# Patient Record
Sex: Female | Born: 1940 | Race: White | Hispanic: No | Marital: Married | Smoking: Never smoker
Health system: Southern US, Community
[De-identification: ages and names within clinical notes are randomized; demographics above are authoritative.]

## PROBLEM LIST (undated history)

## (undated) DIAGNOSIS — R112 Nausea with vomiting, unspecified: Secondary | ICD-10-CM

## (undated) DIAGNOSIS — I10 Essential (primary) hypertension: Secondary | ICD-10-CM

## (undated) DIAGNOSIS — E7800 Pure hypercholesterolemia, unspecified: Secondary | ICD-10-CM

## (undated) DIAGNOSIS — M1990 Unspecified osteoarthritis, unspecified site: Secondary | ICD-10-CM

## (undated) DIAGNOSIS — Z9889 Other specified postprocedural states: Secondary | ICD-10-CM

## (undated) DIAGNOSIS — K859 Acute pancreatitis without necrosis or infection, unspecified: Secondary | ICD-10-CM

## (undated) HISTORY — PX: BREAST SURGERY: SHX581

## (undated) HISTORY — PX: COLONOSCOPY: SHX174

## (undated) HISTORY — PX: TUBAL LIGATION: SHX77

## (undated) HISTORY — PX: BACK SURGERY: SHX140

## (undated) HISTORY — PX: JOINT REPLACEMENT: SHX530

---

## 2002-04-04 ENCOUNTER — Encounter: Payer: Self-pay | Admitting: Emergency Medicine

## 2002-04-04 ENCOUNTER — Emergency Department (HOSPITAL_COMMUNITY): Admission: EM | Admit: 2002-04-04 | Discharge: 2002-04-04 | Payer: Self-pay | Admitting: Emergency Medicine

## 2005-04-21 ENCOUNTER — Ambulatory Visit (HOSPITAL_COMMUNITY): Admission: RE | Admit: 2005-04-21 | Discharge: 2005-04-21 | Payer: Self-pay | Admitting: Surgery

## 2005-04-21 ENCOUNTER — Encounter (INDEPENDENT_AMBULATORY_CARE_PROVIDER_SITE_OTHER): Payer: Self-pay | Admitting: Specialist

## 2006-08-30 ENCOUNTER — Other Ambulatory Visit: Admission: RE | Admit: 2006-08-30 | Discharge: 2006-08-30 | Payer: Self-pay | Admitting: Family Medicine

## 2010-06-02 LAB — SURGICAL PCR SCREEN: Staphylococcus aureus: NEGATIVE

## 2010-06-02 LAB — PROTIME-INR
INR: 1.04 (ref 0.00–1.49)
Prothrombin Time: 13.8 seconds (ref 11.6–15.2)

## 2010-06-02 LAB — URINALYSIS, ROUTINE W REFLEX MICROSCOPIC
Nitrite: NEGATIVE
Specific Gravity, Urine: 1.015 (ref 1.005–1.030)
Urine Glucose, Fasting: NEGATIVE mg/dL
pH: 5.5 (ref 5.0–8.0)

## 2010-06-02 LAB — COMPREHENSIVE METABOLIC PANEL
ALT: 11 U/L (ref 0–35)
AST: 21 U/L (ref 0–37)
Albumin: 3.8 g/dL (ref 3.5–5.2)
Alkaline Phosphatase: 99 U/L (ref 39–117)
BUN: 17 mg/dL (ref 6–23)
CO2: 23 mEq/L (ref 19–32)
Calcium: 10.1 mg/dL (ref 8.4–10.5)
Chloride: 107 mEq/L (ref 96–112)
Creatinine, Ser: 0.69 mg/dL (ref 0.4–1.2)
GFR calc Af Amer: >60 mL/min (ref >60)
GFR calc non Af Amer: >60 mL/min (ref >60)
Glucose, Bld: 131 mg/dL — ABNORMAL HIGH (ref 70–99)
Potassium: 4.4 mEq/L (ref 3.5–5.1)
Sodium: 142 mEq/L (ref 135–145)
Total Bilirubin: 0.6 mg/dL (ref 0.3–1.2)
Total Protein: 7.2 g/dL (ref 6.0–8.3)

## 2010-06-02 LAB — CBC
HCT: 38.3 % (ref 36.0–46.0)
Hemoglobin: 13.2 g/dL (ref 12.0–15.0)
MCH: 31.3 pg (ref 26.0–34.0)
MCHC: 34.5 g/dL (ref 30.0–36.0)
MCV: 90.8 fL (ref 78.0–100.0)
Platelets: 256 10*3/uL (ref 150–400)
RBC: 4.22 MIL/uL (ref 3.87–5.11)
RDW: 12.5 % (ref 11.5–15.5)
WBC: 10.4 10*3/uL (ref 4.0–10.5)

## 2010-06-02 LAB — DIFFERENTIAL
Basophils Absolute: 0.1 10*3/uL (ref 0.0–0.1)
Basophils Relative: 1 % (ref 0–1)
Eosinophils Absolute: 0.8 10*3/uL — ABNORMAL HIGH (ref 0.0–0.7)
Eosinophils Relative: 8 % — ABNORMAL HIGH (ref 0–5)
Lymphocytes Relative: 35 % (ref 12–46)
Lymphs Abs: 3.7 10*3/uL (ref 0.7–4.0)
Monocytes Absolute: 0.7 10*3/uL (ref 0.1–1.0)
Monocytes Relative: 7 % (ref 3–12)
Neutro Abs: 5.1 10*3/uL (ref 1.7–7.7)
Neutrophils Relative %: 49 % (ref 43–77)

## 2010-06-02 LAB — URINE MICROSCOPIC-ADD ON

## 2010-06-02 LAB — APTT: aPTT: 28 seconds (ref 24–37)

## 2010-06-09 ENCOUNTER — Inpatient Hospital Stay (HOSPITAL_COMMUNITY)
Admission: RE | Admit: 2010-06-09 | Discharge: 2010-06-14 | DRG: 470 | Disposition: A | Discharge status: Home Health | Payer: Medicare Other | Source: Ambulatory Visit | Attending: Orthopaedic Surgery | Admitting: Orthopaedic Surgery

## 2010-06-09 ENCOUNTER — Inpatient Hospital Stay (HOSPITAL_COMMUNITY): Payer: Medicare Other

## 2010-06-09 DIAGNOSIS — I10 Essential (primary) hypertension: Secondary | ICD-10-CM | POA: Diagnosis present

## 2010-06-09 DIAGNOSIS — E669 Obesity, unspecified: Secondary | ICD-10-CM | POA: Diagnosis present

## 2010-06-09 DIAGNOSIS — IMO0002 Reserved for concepts with insufficient information to code with codable children: Principal | ICD-10-CM | POA: Diagnosis present

## 2010-06-09 DIAGNOSIS — K5900 Constipation, unspecified: Secondary | ICD-10-CM | POA: Diagnosis not present

## 2010-06-09 DIAGNOSIS — E119 Type 2 diabetes mellitus without complications: Secondary | ICD-10-CM | POA: Diagnosis present

## 2010-06-09 DIAGNOSIS — M1710 Unilateral primary osteoarthritis, unspecified knee: Principal | ICD-10-CM | POA: Diagnosis present

## 2010-06-09 LAB — TYPE AND SCREEN: ABO/RH(D): A POS

## 2010-06-09 LAB — GLUCOSE, CAPILLARY: Glucose-Capillary: 161 mg/dL — ABNORMAL HIGH (ref 70–99)

## 2010-06-10 LAB — CBC
Hemoglobin: 11.5 g/dL — ABNORMAL LOW (ref 12.0–15.0)
MCV: 91.8 fL (ref 78.0–100.0)
Platelets: 232 10*3/uL (ref 150–400)
RBC: 3.67 MIL/uL — ABNORMAL LOW (ref 3.87–5.11)
WBC: 17.6 10*3/uL — ABNORMAL HIGH (ref 4.0–10.5)

## 2010-06-10 LAB — BASIC METABOLIC PANEL
CO2: 25 mEq/L (ref 19–32)
Calcium: 9.2 mg/dL (ref 8.4–10.5)
GFR calc Af Amer: >60 mL/min (ref >60)
GFR calc non Af Amer: >60 mL/min (ref >60)
Sodium: 134 mEq/L — ABNORMAL LOW (ref 135–145)

## 2010-06-10 LAB — PROTIME-INR: Prothrombin Time: 14 seconds (ref 11.6–15.2)

## 2010-06-10 LAB — GLUCOSE, CAPILLARY: Glucose-Capillary: 189 mg/dL — ABNORMAL HIGH (ref 70–99)

## 2010-06-11 LAB — GLUCOSE, CAPILLARY
Glucose-Capillary: 206 mg/dL — ABNORMAL HIGH (ref 70–99)
Glucose-Capillary: 208 mg/dL — ABNORMAL HIGH (ref 70–99)
Glucose-Capillary: 190 mg/dL — ABNORMAL HIGH (ref 70–99)

## 2010-06-11 LAB — CBC
Platelets: 216 10*3/uL (ref 150–400)
RBC: 3.45 MIL/uL — ABNORMAL LOW (ref 3.87–5.11)
WBC: 15.6 10*3/uL — ABNORMAL HIGH (ref 4.0–10.5)

## 2010-06-11 LAB — BASIC METABOLIC PANEL
Chloride: 99 mEq/L (ref 96–112)
GFR calc Af Amer: >60 mL/min (ref >60)
Potassium: 4 mEq/L (ref 3.5–5.1)

## 2010-06-11 LAB — PROTIME-INR: Prothrombin Time: 16.5 seconds — ABNORMAL HIGH (ref 11.6–15.2)

## 2010-06-12 LAB — GLUCOSE, CAPILLARY
Glucose-Capillary: 184 mg/dL — ABNORMAL HIGH (ref 70–99)
Glucose-Capillary: 191 mg/dL — ABNORMAL HIGH (ref 70–99)

## 2010-06-12 LAB — PROTIME-INR: INR: 1.86 — ABNORMAL HIGH (ref 0.00–1.49)

## 2010-06-13 LAB — GLUCOSE, CAPILLARY: Glucose-Capillary: 158 mg/dL — ABNORMAL HIGH (ref 70–99)

## 2010-06-13 LAB — PROTIME-INR: Prothrombin Time: 21.8 seconds — ABNORMAL HIGH (ref 11.6–15.2)

## 2010-06-13 LAB — POTASSIUM: Potassium: 3.5 mEq/L (ref 3.5–5.1)

## 2010-06-14 LAB — GLUCOSE, CAPILLARY: Glucose-Capillary: 208 mg/dL — ABNORMAL HIGH (ref 70–99)

## 2010-06-17 NOTE — Op Note (Signed)
[name], [name]             ACCOUNT NO.:  [id number]  MEDICAL RECORD NO.:  [id number]           PATIENT TYPE:  I  LOCATION:  5022                         FACILITY:  [facility]  PHYSICIAN:  Mark C. Ophelia Charter, M.D.    DATE OF BIRTH:  1940/11/20  DATE OF PROCEDURE:  06/09/2010 DATE OF DISCHARGE:                              OPERATIVE REPORT   PREOPERATIVE DIAGNOSIS:  Right knee osteoarthritis with valgus deformity.  POSTOPERATIVE DIAGNOSIS:  Right knee osteoarthritis with valgus deformity.  PROCEDURE:  Right total knee arthroplasty cemented, computer assist.  SURGEON:  Mark C. Ophelia Charter, MD  ASSISTANT:  Wende Neighbors, PA-C present for the entire procedure, medical necessary, for implantation, retraction of neurovascular structures.  ANESTHESIA:  GOT plus Marcaine skin local and preoperative femoral nerve block.  ESTIMATED BLOOD LOSS:  Less than 50 mL.  TOURNIQUET TIME:  60 minutes.  DRAINS:  None.  COMPONENTS USED:  A #3 femur, #3 tibia, 10-mm spacer, 35 mm all poly three-peg patella.  Femoral component with lugs.  After induction of general anesthesia, orotracheal intubation, preoperative Ancef prophylaxis 2 g, standard prepping and draping with the proximal thigh tourniquet, usual impervious stockinette, Coban and total knee sheets drapes, sterile skin marker, Betadine, Steri-Strips sealing the skin.  Time-out procedure was completed.  Leg was wrapped with an Esmarch, tourniquet deflated.  Midline incision was made. Superficial retinaculum was divided.  Deep retinaculum was split between the medial one third and lateral two thirds.  Toe was flipped over, patella was cut removing 10 mm of bone.  Spurs removed off the femur particularly lateral where there were larger that was eburnated grade 4 chondromalacia and weightbearing portion of the lateral compartment.  Menisci remnants were resected.  ACL, PCL resected.  Two pins were placed in the femur inside the incision.   Two stab incisions were made and pins were placed distally in the mid tibia outside the incision.  Initial cut was made on the femur after computer models were generated for the tibia and the femur.  Initial alignment showed that there was 4.5 degree flexion contracture and 7.9 degrees valgus, 9 mm cut on the femur, 8 mm were cut was cut on the tibia.  Sizing suggest was #3, rotation cut was based on femoral condyles, #4 narrow still looked slightly large, #3 was selected and cuts were made, chamfer cuts.  Box cut was not made until the AP cut was made on the tibia.  Meniscal remnants were resected posteriorly.  Three-quarter curved osteotome was used to remove the spurs off the posterior condyles on the femur.  Box cuts were made.  Trials were inserted and there was near full extension, lag 3 degrees reached to full extension although grossly looked like it came full extended.  There was 1/2 mm difference in flexion and extension balance.  Collateral ligaments were stable.  Identical findings in flexion.  A little bit more posterior capsule was stripped off the femur with the knee in the flexed position pulling anteriorly and then pulsatile lavage vacuum mixing, some removal of small remnant pieces of bone, they were  palpated during the pulsatile lavage. Suctioned  dry, dried with a sponge, cement vacuum mixed, tibia was implanted first, followed by femur, impacted down, all excessive cement was removed, 10-mm all poly rotating platform DePuy spacer was inserted. All components were DePuy J and J rotating platform, femoral component was with lugs and the patella was all polyethylene three peg.  A 35-mm patella was inserted, clamped down.  At 10 mm of bone had been resected on the patella.  There was good patellar tracking, cement was hardened at 15 minutes.  Tourniquet was deflated which was set 1 hour time and then standard layered closure with nonabsorbable in the quad tendon, deep  retinaculum, repair of the patellar tendon to the medial capsule. A 2-0 Vicryl in subcutaneous tissue, superficial retinaculum and then subcuticular Vicryl suture in the skin.  Tincture of Benzoin, Marcaine infiltration, Steri-Strips, postop dressing and a knee immobilizer. Instrument count and needle count was correct.  The patient tolerated the procedure well, was transferred to the recovery room in stable condition.     Mark C. Ophelia Charter, M.D.     MCY/MEDQ  D:  06/09/2010  T:  06/10/2010  Job:  [id number]  Electronically Signed by Annell Greening M.D. on 06/17/2010 05:07:39 PM

## 2010-07-29 NOTE — Discharge Summary (Signed)
[name], [name]             ACCOUNT NO.:  [id number]  MEDICAL RECORD NO.:  [id number]           PATIENT TYPE:  I  LOCATION:  5022                         FACILITY:  [facility]  PHYSICIAN:  [name], M.D.    DATE OF BIRTH:  [birth year]  DATE OF ADMISSION:  06/09/2010 DATE OF DISCHARGE:  06/14/2010                              DISCHARGE SUMMARY   ADMISSION DIAGNOSES: 1. Right knee osteoarthritis. 2. Diabetes mellitus. 3. Hypertension. 4. Elevated lipids. 5. Gout. 6. History of pancreatitis, 1992.  DISCHARGE DIAGNOSES: 1. Right knee osteoarthritis. 2. Diabetes mellitus. 3. Hypertension. 4. Elevated lipids. 5. Gout. 6. History of pancreatitis, 1992. 7. Posthemorrhagic anemia.  PROCEDURE:  On June 09, 2010, the patient underwent right total knee arthroplasty cemented and computer assisted performed by Dr. Ophelia Buck, assisted by Maud Deed, PA-C, under general anesthesia and preoperative femoral nerve block.  CONSULTATIONS:  None.  BRIEF HISTORY:  The patient is a 70 year old female with chronic and progressive pain in the right knee.  She has radiographs indicating severe osteoarthritis of the right knee.  She has tried conservative treatment including anti-inflammatory medications as well as intra- articular steroid injections and behavior modification.  It was felt that she would benefit from surgical intervention.  She was admitted for the procedure as stated above.  BRIEF HOSPITAL COURSE:  The patient tolerated the procedure under general anesthesia without complications.  Postoperatively, neurovascular motor function of the lower extremities was noted to be intact.  The patient was started on the CPM machine for passive range of motion.  She received physical therapy for active range of motion, stretching, strengthening exercises.  Also, physical therapy for ambulation and gait training utilizing a walker and weightbearing as tolerated on the operative  extremity.  Dressing changes were done daily and the patient's wound was healing without drainage or infection.  She was started on Coumadin for DVT prophylaxis.  Adjustments made according to daily protimes by the pharmacist at [facility].  Blood sugars were elevated, and she was restarted on her metformin as well as Lantus insulin added to her evening dose.  The patient was treated with PCA analgesics initially and weaned to p.o. analgesics without difficulty. She was slow with progression of activity.  She developed nausea which initially resolved and then returned again on the third postoperative day.  Stool softeners were used to assist with bowel movements.  The patient's diet was held to clear liquids and she was given Reglan.  Her nausea improved with these modalities and she was able to be discharged to her home on June 14, 2010.  As she was slow to progress with physical therapy and continued to have quad weakness and unable to do a straight leg raise, it was felt she would benefit from home health physical therapy which was arranged prior to her discharge.  Knee immobilizer was utilized for ambulation only.  PERTINENT LABORATORY VALUES:  Admission CBC within normal limits. Hemoglobin and hematocrit dropped to the lowest value of 10.6 and 31.2. INR at discharge 2.09.  Chemistry studies on admission within normal limits.  The patient had one episode of hyponatremia at 134.  Blood sugar was elevated through much of the hospital stay.  Hemoglobin A1c 7.6.  Urinalysis on admission with 11-20 wbc's per high-powered field and few bacteria.  The patient received the usual perioperative antibiotics including Ancef.  PLAN:  The patient was discharged to home.  Arrangements were made for home health physical therapy and durable medical equipment.  She will change her dressing daily or as needed.  She will continue on a low- carbohydrate diet.  She will be allowed to shower in 1  week or when there is no further drainage.  She is to wear her knee immobilizer at all times when ambulatory but will have this off for range of motion and stretching exercises.  She also does not need to wear the knee immobilizer in bed.  She is encouraged to move the knee as much as tolerable.  She was instructed to follow up with her primary care physician over the next few weeks in regards to her elevated blood sugars as well as her hemoglobin A1c of 7.6.  She will follow up with Dr. Ophelia Buck in 2 weeks.  She is advised to call the office should she have questions or concerns prior to her return office visit.  MEDICATIONS AT DISCHARGE:  The patient will continue home medications as taken prior to admission.  She was given prescriptions for Coumadin per pharmacy, Colace one p.o. b.i.d., Vicodin 1-2 every 4-6 hours as needed for pain, Robaxin 500 mg one every 6-8 hours as needed for spasm, Reglan 10 mg one every 8 hours as needed for nausea.  CONDITION ON DISCHARGE:  Stable.     [name], P.A.   ______________________________ Veverly Fells Ophelia Buck, M.D.    SMV/MEDQ  D:  07/08/2010  T:  07/09/2010  Job:  [id number]  Electronically Signed by Maud Deed P.A. on 07/10/2010 09:19:40 AM Electronically Signed by Annell Greening M.D. on 07/29/2010 05:58:20 PM

## 2010-09-19 NOTE — Op Note (Signed)
NAMEARALYN, [name]             ACCOUNT NO.:  [id number]   MEDICAL RECORD NO.:  [id number]          PATIENT TYPE:  AMB   LOCATION:  DAY                          FACILITY:  [facility]   PHYSICIAN:  Thomas A. Cornett, M.D.DATE OF BIRTH:  [birth year]   DATE OF PROCEDURE:  04/21/2005  DATE OF DISCHARGE:                                 OPERATIVE REPORT   PREOPERATIVE DIAGNOSIS:  Large lipoma of back.   POSTOPERATIVE DIAGNOSIS:  Large lipoma of back.   PROCEDURE:  1.  Excision of 6 x 6 cm lipoma from back, deep.  2.  Two-layer closure of wound.   SURGEON:  Harriette Bouillon, MD.   ANESTHESIA:  General endotracheal anesthesia with 20 mL of 0.5% Sensorcaine  with epinephrine.   ESTIMATED BLOOD LOSS:  10 mL.   DRAINS:  None.   SPECIMEN:  6 x 6 cm fatty mass to pathology for evaluation.   INDICATIONS FOR PROCEDURE:  The patient is a 70 year old female who has a  large lipoma over her posterior back in the midline in between her scapula.  It causes her pain when she lays on it and she is here today to have it  removed.   DESCRIPTION OF PROCEDURE:  The patient was brought to the operating suite  and placed supine. After induction of general endotracheal anesthesia, the  patient was placed in a prone position after initiation of anesthesia. She  was then in prone and her back was prepped and draped in a sterile fashion.  She received preoperative antibiotics. In her midline was roughly a 6 x 6 cm  lobulated mass which was in the deep subcutaneous tissues. An incision was  made over this after infiltration with 0.5% Sensorcaine local. Dissection  was carried down around what appeared to be a lipoma and it was excised in  its entirety measuring 6 x 6 cm. There was a large defect from removal of  this mass and this was closed in two layers using a deep layer of 3-0 Vicryl  and 4-0 Monocryl was used to close  the skin incision. Sterile dressings were applied. All final counts of  sponge,  needle and instruments were counted and found to be correct at this  portion of the case. The patient was awoke, taken to recovery in  satisfactory condition.      Thomas A. Cornett, M.D.  Electronically Signed     TAC/MEDQ  D:  04/21/2005  T:  04/22/2005  Job:  [id number]   cc:   Otilio Connors. Gerri Spore, M.D.  Fax: [phone]

## 2011-08-20 DIAGNOSIS — E669 Obesity, unspecified: Secondary | ICD-10-CM | POA: Diagnosis not present

## 2011-08-20 DIAGNOSIS — E1129 Type 2 diabetes mellitus with other diabetic kidney complication: Secondary | ICD-10-CM | POA: Diagnosis not present

## 2011-08-20 DIAGNOSIS — N181 Chronic kidney disease, stage 1: Secondary | ICD-10-CM | POA: Diagnosis not present

## 2011-08-20 DIAGNOSIS — E782 Mixed hyperlipidemia: Secondary | ICD-10-CM | POA: Diagnosis not present

## 2011-08-20 DIAGNOSIS — I10 Essential (primary) hypertension: Secondary | ICD-10-CM | POA: Diagnosis not present

## 2012-01-24 DIAGNOSIS — Z23 Encounter for immunization: Secondary | ICD-10-CM | POA: Diagnosis not present

## 2012-02-16 DIAGNOSIS — Z1231 Encounter for screening mammogram for malignant neoplasm of breast: Secondary | ICD-10-CM | POA: Diagnosis not present

## 2012-02-23 DIAGNOSIS — I10 Essential (primary) hypertension: Secondary | ICD-10-CM | POA: Diagnosis not present

## 2012-02-23 DIAGNOSIS — E1142 Type 2 diabetes mellitus with diabetic polyneuropathy: Secondary | ICD-10-CM | POA: Diagnosis not present

## 2012-02-23 DIAGNOSIS — Z0000 Encounter for general adult medical examination without abnormal findings: Secondary | ICD-10-CM | POA: Diagnosis not present

## 2012-02-23 DIAGNOSIS — E782 Mixed hyperlipidemia: Secondary | ICD-10-CM | POA: Diagnosis not present

## 2012-02-23 DIAGNOSIS — E559 Vitamin D deficiency, unspecified: Secondary | ICD-10-CM | POA: Diagnosis not present

## 2012-02-23 DIAGNOSIS — E1149 Type 2 diabetes mellitus with other diabetic neurological complication: Secondary | ICD-10-CM | POA: Diagnosis not present

## 2012-02-23 DIAGNOSIS — Z79899 Other long term (current) drug therapy: Secondary | ICD-10-CM | POA: Diagnosis not present

## 2012-04-12 DIAGNOSIS — M899 Disorder of bone, unspecified: Secondary | ICD-10-CM | POA: Diagnosis not present

## 2012-04-12 DIAGNOSIS — M949 Disorder of cartilage, unspecified: Secondary | ICD-10-CM | POA: Diagnosis not present

## 2012-08-23 DIAGNOSIS — E1149 Type 2 diabetes mellitus with other diabetic neurological complication: Secondary | ICD-10-CM | POA: Diagnosis not present

## 2012-08-23 DIAGNOSIS — E785 Hyperlipidemia, unspecified: Secondary | ICD-10-CM | POA: Diagnosis not present

## 2012-08-23 DIAGNOSIS — E1142 Type 2 diabetes mellitus with diabetic polyneuropathy: Secondary | ICD-10-CM | POA: Diagnosis not present

## 2012-08-23 DIAGNOSIS — I10 Essential (primary) hypertension: Secondary | ICD-10-CM | POA: Diagnosis not present

## 2013-01-11 DIAGNOSIS — Z23 Encounter for immunization: Secondary | ICD-10-CM | POA: Diagnosis not present

## 2013-02-16 DIAGNOSIS — Z1231 Encounter for screening mammogram for malignant neoplasm of breast: Secondary | ICD-10-CM | POA: Diagnosis not present

## 2013-03-16 DIAGNOSIS — E559 Vitamin D deficiency, unspecified: Secondary | ICD-10-CM | POA: Diagnosis not present

## 2013-03-16 DIAGNOSIS — E782 Mixed hyperlipidemia: Secondary | ICD-10-CM | POA: Diagnosis not present

## 2013-03-16 DIAGNOSIS — M6530 Trigger finger, unspecified finger: Secondary | ICD-10-CM | POA: Diagnosis not present

## 2013-03-16 DIAGNOSIS — E1149 Type 2 diabetes mellitus with other diabetic neurological complication: Secondary | ICD-10-CM | POA: Diagnosis not present

## 2013-03-16 DIAGNOSIS — Z0000 Encounter for general adult medical examination without abnormal findings: Secondary | ICD-10-CM | POA: Diagnosis not present

## 2013-03-16 DIAGNOSIS — E1142 Type 2 diabetes mellitus with diabetic polyneuropathy: Secondary | ICD-10-CM | POA: Diagnosis not present

## 2013-03-16 DIAGNOSIS — I10 Essential (primary) hypertension: Secondary | ICD-10-CM | POA: Diagnosis not present

## 2013-03-16 DIAGNOSIS — M1990 Unspecified osteoarthritis, unspecified site: Secondary | ICD-10-CM | POA: Diagnosis not present

## 2013-03-17 DIAGNOSIS — E559 Vitamin D deficiency, unspecified: Secondary | ICD-10-CM | POA: Diagnosis not present

## 2013-03-17 DIAGNOSIS — Z79899 Other long term (current) drug therapy: Secondary | ICD-10-CM | POA: Diagnosis not present

## 2013-03-22 DIAGNOSIS — M25539 Pain in unspecified wrist: Secondary | ICD-10-CM | POA: Diagnosis not present

## 2013-03-22 DIAGNOSIS — M779 Enthesopathy, unspecified: Secondary | ICD-10-CM | POA: Diagnosis not present

## 2013-08-10 DIAGNOSIS — M79609 Pain in unspecified limb: Secondary | ICD-10-CM | POA: Diagnosis not present

## 2013-09-15 DIAGNOSIS — E1142 Type 2 diabetes mellitus with diabetic polyneuropathy: Secondary | ICD-10-CM | POA: Diagnosis not present

## 2013-09-15 DIAGNOSIS — I10 Essential (primary) hypertension: Secondary | ICD-10-CM | POA: Diagnosis not present

## 2013-09-15 DIAGNOSIS — E1149 Type 2 diabetes mellitus with other diabetic neurological complication: Secondary | ICD-10-CM | POA: Diagnosis not present

## 2013-09-15 DIAGNOSIS — Z6836 Body mass index (BMI) 36.0-36.9, adult: Secondary | ICD-10-CM | POA: Diagnosis not present

## 2013-09-15 DIAGNOSIS — E785 Hyperlipidemia, unspecified: Secondary | ICD-10-CM | POA: Diagnosis not present

## 2013-10-25 DIAGNOSIS — H2510 Age-related nuclear cataract, unspecified eye: Secondary | ICD-10-CM | POA: Diagnosis not present

## 2014-01-12 DIAGNOSIS — Z23 Encounter for immunization: Secondary | ICD-10-CM | POA: Diagnosis not present

## 2014-02-20 DIAGNOSIS — Z1231 Encounter for screening mammogram for malignant neoplasm of breast: Secondary | ICD-10-CM | POA: Diagnosis not present

## 2014-02-20 DIAGNOSIS — Z803 Family history of malignant neoplasm of breast: Secondary | ICD-10-CM | POA: Diagnosis not present

## 2014-03-21 ENCOUNTER — Other Ambulatory Visit: Payer: Self-pay | Admitting: Family Medicine

## 2014-03-21 DIAGNOSIS — I10 Essential (primary) hypertension: Secondary | ICD-10-CM | POA: Diagnosis not present

## 2014-03-21 DIAGNOSIS — M899 Disorder of bone, unspecified: Secondary | ICD-10-CM | POA: Diagnosis not present

## 2014-03-21 DIAGNOSIS — M1990 Unspecified osteoarthritis, unspecified site: Secondary | ICD-10-CM | POA: Diagnosis not present

## 2014-03-21 DIAGNOSIS — E1142 Type 2 diabetes mellitus with diabetic polyneuropathy: Secondary | ICD-10-CM | POA: Diagnosis not present

## 2014-03-21 DIAGNOSIS — R0989 Other specified symptoms and signs involving the circulatory and respiratory systems: Secondary | ICD-10-CM

## 2014-03-21 DIAGNOSIS — Z23 Encounter for immunization: Secondary | ICD-10-CM | POA: Diagnosis not present

## 2014-03-21 DIAGNOSIS — E785 Hyperlipidemia, unspecified: Secondary | ICD-10-CM | POA: Diagnosis not present

## 2014-03-21 DIAGNOSIS — E559 Vitamin D deficiency, unspecified: Secondary | ICD-10-CM | POA: Diagnosis not present

## 2014-03-21 DIAGNOSIS — M65312 Trigger thumb, left thumb: Secondary | ICD-10-CM | POA: Diagnosis not present

## 2014-03-21 DIAGNOSIS — Z0000 Encounter for general adult medical examination without abnormal findings: Secondary | ICD-10-CM | POA: Diagnosis not present

## 2014-04-02 ENCOUNTER — Ambulatory Visit
Admission: RE | Admit: 2014-04-02 | Discharge: 2014-04-02 | Disposition: A | Discharge status: Home / Self Care | Payer: Medicare Other | Source: Ambulatory Visit | Attending: Family Medicine | Admitting: Family Medicine

## 2014-04-02 DIAGNOSIS — I10 Essential (primary) hypertension: Secondary | ICD-10-CM | POA: Diagnosis not present

## 2014-04-02 DIAGNOSIS — R0989 Other specified symptoms and signs involving the circulatory and respiratory systems: Secondary | ICD-10-CM | POA: Diagnosis not present

## 2015-01-15 DIAGNOSIS — N3000 Acute cystitis without hematuria: Secondary | ICD-10-CM | POA: Diagnosis not present

## 2015-01-30 DIAGNOSIS — Z23 Encounter for immunization: Secondary | ICD-10-CM | POA: Diagnosis not present

## 2015-02-22 DIAGNOSIS — Z1231 Encounter for screening mammogram for malignant neoplasm of breast: Secondary | ICD-10-CM | POA: Diagnosis not present

## 2015-03-06 DIAGNOSIS — Z1231 Encounter for screening mammogram for malignant neoplasm of breast: Secondary | ICD-10-CM | POA: Diagnosis not present

## 2015-03-06 DIAGNOSIS — R928 Other abnormal and inconclusive findings on diagnostic imaging of breast: Secondary | ICD-10-CM | POA: Diagnosis not present

## 2015-03-06 DIAGNOSIS — R922 Inconclusive mammogram: Secondary | ICD-10-CM | POA: Diagnosis not present

## 2015-03-27 DIAGNOSIS — M5430 Sciatica, unspecified side: Secondary | ICD-10-CM | POA: Diagnosis not present

## 2015-04-04 DIAGNOSIS — G629 Polyneuropathy, unspecified: Secondary | ICD-10-CM | POA: Diagnosis not present

## 2015-04-04 DIAGNOSIS — Z79899 Other long term (current) drug therapy: Secondary | ICD-10-CM | POA: Diagnosis not present

## 2015-04-04 DIAGNOSIS — I10 Essential (primary) hypertension: Secondary | ICD-10-CM | POA: Diagnosis not present

## 2015-04-04 DIAGNOSIS — M899 Disorder of bone, unspecified: Secondary | ICD-10-CM | POA: Diagnosis not present

## 2015-04-04 DIAGNOSIS — E559 Vitamin D deficiency, unspecified: Secondary | ICD-10-CM | POA: Diagnosis not present

## 2015-04-04 DIAGNOSIS — Z96651 Presence of right artificial knee joint: Secondary | ICD-10-CM | POA: Diagnosis not present

## 2015-04-04 DIAGNOSIS — E785 Hyperlipidemia, unspecified: Secondary | ICD-10-CM | POA: Diagnosis not present

## 2015-04-04 DIAGNOSIS — E1142 Type 2 diabetes mellitus with diabetic polyneuropathy: Secondary | ICD-10-CM | POA: Diagnosis not present

## 2015-04-04 DIAGNOSIS — M1990 Unspecified osteoarthritis, unspecified site: Secondary | ICD-10-CM | POA: Diagnosis not present

## 2015-04-04 DIAGNOSIS — Z0000 Encounter for general adult medical examination without abnormal findings: Secondary | ICD-10-CM | POA: Diagnosis not present

## 2015-04-09 DIAGNOSIS — M4328 Fusion of spine, sacral and sacrococcygeal region: Secondary | ICD-10-CM | POA: Diagnosis not present

## 2015-04-09 DIAGNOSIS — M5432 Sciatica, left side: Secondary | ICD-10-CM | POA: Diagnosis not present

## 2015-04-11 DIAGNOSIS — M25552 Pain in left hip: Secondary | ICD-10-CM | POA: Diagnosis not present

## 2015-04-11 DIAGNOSIS — M5136 Other intervertebral disc degeneration, lumbar region: Secondary | ICD-10-CM | POA: Diagnosis not present

## 2015-04-11 DIAGNOSIS — M25551 Pain in right hip: Secondary | ICD-10-CM | POA: Diagnosis not present

## 2015-04-15 DIAGNOSIS — M5115 Intervertebral disc disorders with radiculopathy, thoracolumbar region: Secondary | ICD-10-CM | POA: Diagnosis not present

## 2015-04-15 DIAGNOSIS — M4726 Other spondylosis with radiculopathy, lumbar region: Secondary | ICD-10-CM | POA: Diagnosis not present

## 2015-04-15 DIAGNOSIS — M4727 Other spondylosis with radiculopathy, lumbosacral region: Secondary | ICD-10-CM | POA: Diagnosis not present

## 2015-04-15 DIAGNOSIS — M7542 Impingement syndrome of left shoulder: Secondary | ICD-10-CM | POA: Diagnosis not present

## 2015-04-26 DIAGNOSIS — M545 Low back pain: Secondary | ICD-10-CM | POA: Diagnosis not present

## 2015-04-26 DIAGNOSIS — Z1211 Encounter for screening for malignant neoplasm of colon: Secondary | ICD-10-CM | POA: Diagnosis not present

## 2015-05-13 DIAGNOSIS — M545 Low back pain: Secondary | ICD-10-CM | POA: Diagnosis not present

## 2015-05-17 DIAGNOSIS — M5116 Intervertebral disc disorders with radiculopathy, lumbar region: Secondary | ICD-10-CM | POA: Diagnosis not present

## 2015-06-05 DIAGNOSIS — M5416 Radiculopathy, lumbar region: Secondary | ICD-10-CM | POA: Diagnosis not present

## 2015-06-05 DIAGNOSIS — M5116 Intervertebral disc disorders with radiculopathy, lumbar region: Secondary | ICD-10-CM | POA: Diagnosis not present

## 2015-07-09 ENCOUNTER — Other Ambulatory Visit: Payer: Self-pay | Admitting: Orthopaedic Surgery

## 2015-07-09 DIAGNOSIS — M5416 Radiculopathy, lumbar region: Secondary | ICD-10-CM | POA: Diagnosis not present

## 2015-07-09 DIAGNOSIS — M5116 Intervertebral disc disorders with radiculopathy, lumbar region: Secondary | ICD-10-CM | POA: Diagnosis not present

## 2015-07-09 DIAGNOSIS — M545 Low back pain: Secondary | ICD-10-CM | POA: Diagnosis not present

## 2015-07-18 ENCOUNTER — Ambulatory Visit (HOSPITAL_COMMUNITY)
Admission: RE | Admit: 2015-07-18 | Discharge: 2015-07-18 | Disposition: A | Discharge status: Home / Self Care | Payer: Commercial Managed Care - HMO | Source: Ambulatory Visit | Attending: Orthopaedic Surgery | Admitting: Orthopaedic Surgery

## 2015-07-18 ENCOUNTER — Encounter (HOSPITAL_COMMUNITY)
Admission: RE | Admit: 2015-07-18 | Discharge: 2015-07-18 | Disposition: A | Discharge status: Home / Self Care | Payer: Commercial Managed Care - HMO | Source: Ambulatory Visit | Attending: Orthopaedic Surgery | Admitting: Orthopaedic Surgery

## 2015-07-18 ENCOUNTER — Encounter (HOSPITAL_COMMUNITY): Payer: Self-pay

## 2015-07-18 ENCOUNTER — Other Ambulatory Visit (HOSPITAL_COMMUNITY): Payer: Self-pay | Admitting: *Deleted

## 2015-07-18 DIAGNOSIS — Z01818 Encounter for other preprocedural examination: Secondary | ICD-10-CM | POA: Diagnosis not present

## 2015-07-18 DIAGNOSIS — Z01812 Encounter for preprocedural laboratory examination: Secondary | ICD-10-CM | POA: Diagnosis not present

## 2015-07-18 DIAGNOSIS — M5126 Other intervertebral disc displacement, lumbar region: Secondary | ICD-10-CM | POA: Diagnosis not present

## 2015-07-18 DIAGNOSIS — M519 Unspecified thoracic, thoracolumbar and lumbosacral intervertebral disc disorder: Secondary | ICD-10-CM | POA: Diagnosis not present

## 2015-07-18 HISTORY — DX: Acute pancreatitis without necrosis or infection, unspecified: K85.90

## 2015-07-18 HISTORY — DX: Other specified postprocedural states: Z98.890

## 2015-07-18 HISTORY — DX: Essential (primary) hypertension: I10

## 2015-07-18 HISTORY — DX: Pure hypercholesterolemia, unspecified: E78.00

## 2015-07-18 HISTORY — DX: Nausea with vomiting, unspecified: R11.2

## 2015-07-18 HISTORY — DX: Unspecified osteoarthritis, unspecified site: M19.90

## 2015-07-18 LAB — COMPREHENSIVE METABOLIC PANEL
ALBUMIN: 3.5 g/dL (ref 3.5–5.0)
ALK PHOS: 92 U/L (ref 38–126)
ALT: 26 U/L (ref 14–54)
ANION GAP: 16 — AB (ref 5–15)
AST: 37 U/L (ref 15–41)
BUN: 7 mg/dL (ref 6–20)
CALCIUM: 10.2 mg/dL (ref 8.9–10.3)
CO2: 22 mmol/L (ref 22–32)
Chloride: 103 mmol/L (ref 101–111)
Creatinine, Ser: 0.69 mg/dL (ref 0.44–1.00)
GFR calc Af Amer: >60 mL/min (ref >60)
GFR calc non Af Amer: >60 mL/min (ref >60)
GLUCOSE: 184 mg/dL — AB (ref 65–99)
POTASSIUM: 4.1 mmol/L (ref 3.5–5.1)
SODIUM: 141 mmol/L (ref 135–145)
Total Bilirubin: 0.2 mg/dL — ABNORMAL LOW (ref 0.3–1.2)
Total Protein: 7 g/dL (ref 6.5–8.1)

## 2015-07-18 LAB — PROTIME-INR
INR: 0.99 (ref 0.00–1.49)
Prothrombin Time: 13.3 seconds (ref 11.6–15.2)

## 2015-07-18 LAB — SURGICAL PCR SCREEN
MRSA, PCR: NEGATIVE
Staphylococcus aureus: NEGATIVE

## 2015-07-18 LAB — CBC
HCT: 40.4 % (ref 36.0–46.0)
Hemoglobin: 13.3 g/dL (ref 12.0–15.0)
MCH: 30.7 pg (ref 26.0–34.0)
MCHC: 32.9 g/dL (ref 30.0–36.0)
MCV: 93.3 fL (ref 78.0–100.0)
Platelets: 221 10*3/uL (ref 150–400)
RBC: 4.33 MIL/uL (ref 3.87–5.11)
RDW: 13.3 % (ref 11.5–15.5)
WBC: 7.4 10*3/uL (ref 4.0–10.5)

## 2015-07-18 NOTE — Progress Notes (Signed)
Pt denies cardiac history, chest pain or sob. 

## 2015-07-18 NOTE — Pre-Procedure Instructions (Signed)
Morgan CriglerRoberta L Buck  07/18/2015     Your procedure is scheduled on Friday, July 26, 2015 at 7:30 AM.   [facility] Admitting Office at 5:30 AM.   Call this number if you have problems the morning of surgery: [phone]   Any questions prior to day of surgery, please call [phone] between 8 & 4 PM.   Remember:  Do not eat food or drink liquids after midnight Thursday, 07/25/15.    Do not wear jewelry, make-up or nail polish.  Do not wear lotions, powders, or perfumes.  You may wear deodorant.  Do not shave 48 hours prior to surgery.    Do not bring valuables to the hospital.  [facility] any belongings or valuables.  Contacts, dentures or bridgework may not be worn into surgery.  Leave your suitcase in the car.  After surgery it may be brought to your room.  For patients admitted to the hospital, discharge time will be determined by your treatment team.  Special instructions: [facility] - Preparing for Surgery  Before surgery, you can play an important role.  Because skin is not sterile, your skin needs to be as free of germs as possible.  You can reduce the number of germs on you skin by washing with CHG (chlorahexidine gluconate) soap before surgery.  CHG is an antiseptic cleaner which kills germs and bonds with the skin to continue killing germs even after washing.  Please DO NOT use if you have an allergy to CHG or antibacterial soaps.  If your skin becomes reddened/irritated stop using the CHG and inform your nurse when you arrive at Short Stay.  Do not shave (including legs and underarms) for at least 48 hours prior to the first CHG shower.  You may shave your face.  Please follow these instructions carefully:   1.  Shower with CHG Soap the night before surgery and the                                morning of Surgery.  2.  If you choose to wash your hair, wash your hair first as usual with your       normal  shampoo.  3.  After you shampoo, rinse your hair and body thoroughly to remove the                      Shampoo.  4.  Use CHG as you would any other liquid soap.  You can apply chg directly       to the skin and wash gently with scrungie or a clean washcloth.  5.  Apply the CHG Soap to your body ONLY FROM THE NECK DOWN.        Do not use on open wounds or open sores.  Avoid contact with your eyes, ears, mouth and genitals (private parts).  Wash genitals (private parts) with your normal soap.  6.  Wash thoroughly, paying special attention to the area where your surgery        will be performed.  7.  Thoroughly rinse your body with warm water from the neck down.  8.  DO NOT shower/wash with your normal soap after using and rinsing off       the CHG Soap.  9.  Pat yourself dry with a clean towel.  10.  Wear clean pajamas.            11.  Place clean sheets on your bed the night of your first shower and do not        sleep with pets.  Day of Surgery  Do not apply any lotions the morning of surgery.  Please wear clean clothes to the hospital.   Please read over the following fact sheets that you were given. Pain Booklet, Coughing and Deep Breathing, MRSA Information and Surgical Site Infection Prevention

## 2015-07-25 NOTE — H&P (Signed)
[name] is an 75 y.o. female.    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  She also has disk protrusion on the left and somewhat on the right at the L4-5 level, but not as severe.  She had been through epidurals, got a little bit of improvement, then quickly had recurrence of her symptoms as before.  She is normally followed by Dr. Mila Buck.     PAST SURGICAL HISTORY:    She has had previous knee replacement 5 years ago by Dr. Ophelia Buck ago which has done well.     CURRENT MEDICATIONS:  She is on amlodipine, benazepril 5/10 mg daily, she has used indomethacin.     ALLERGIES:    LASIX, OXYCODONE and TRICON.     SOCIAL HISTORY:    She is married to her husband [name].  She does not smoke or drink.    REVIEW OF SYSTEMS:  Positive for kidney disease, hypertension and some knee arthritis.       Past Medical History  Diagnosis Date  . Hypertension   . Arthritis   . High cholesterol   . Pancreatitis   . PONV (postoperative nausea and vomiting)     after knee surgery    Past Surgical History  Procedure Laterality Date  . Joint replacement Right     knee  . Tubal ligation    . Breast surgery      cyst removed from right breast  . Colonoscopy    . Back surgery      "knotty" cyst removed from back    Family History  Problem Relation Age of Onset  . Pancreatic cancer Mother   . Heart disease Father   . Diabetes Father   . Hypertension Father    Social History:  reports that she has never smoked. She has never used smokeless tobacco. She reports that she does not drink alcohol or use illicit drugs.  Allergies:  Allergies  Allergen Reactions  . Lasix [Furosemide] Other (See Comments)    Can't remember reaction  . Percocet [Oxycodone-Acetaminophen] Nausea And Vomiting   . Tricon [Fe Fumarate-B12-Vit C-Fa-Ifc] Other (See Comments)    Muscle pain    No prescriptions prior to admission    No results found for this or any previous visit (from the past 48 hour(s)). No results found.  Review of Systems  Constitutional: Negative.   HENT: Negative.   Eyes: Negative.   Respiratory: Negative.   Cardiovascular: Negative.   Gastrointestinal: Negative.   Genitourinary: Negative.   Musculoskeletal: Positive for back pain.  Skin: Negative.   Psychiatric/Behavioral: Negative.     There were no vitals taken for this visit. Physical Exam  Constitutional: She is oriented to person, place, and time. No distress.  HENT:  Head: Atraumatic.  Eyes: EOM are normal.  Neck: Normal range of motion.  Cardiovascular: Normal rate.   Respiratory: No respiratory distress.  GI: She exhibits no distension.  Musculoskeletal: She exhibits tenderness.  Neurological: She is alert and oriented to person, place, and time.  Skin: Skin is warm and dry.  Psychiatric: She has a normal mood and affect.    PHYSICAL EXAMINATION:  Patient is 5 feet 2 inches, 290 pounds, alert and oriented.  Lungs are clear.  Heart:  Regular rate and rhythm.  No abdominal tenderness.  No hip flexion weakness.  She has some pain with straight leg raising on the left, negative on the right.  No  hip flexion or quad weakness.  Anterior tib EHL is intact.  She has had to use a cane to prevent falling.  She has had several times when her leg gave way, but states "fortunately she has not fallen."    RADIOGRAPHS:    MRI 04/15/2015 showed large disk bulge with left paracentral protrusion impinging on the left L4 nerve root effacing the thecal sac.  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   PLAN:  Left L3-4, L4-5 microdiscectomy.  Her symptoms have been present since 03/26/2015.  She states she cannot tolerate the pain she is having any longer and activity modification,  exercise program, epidural steroids have not been effective.  She has been on Norco pain medication, prednisone pack, indomethacin.  Patient is pre-op pending approval. Naida SleightWENS,[name] M, PA-C 07/25/2015, 2:36 PM

## 2015-07-26 ENCOUNTER — Encounter (HOSPITAL_COMMUNITY): Payer: Self-pay | Admitting: Surgery

## 2015-07-26 ENCOUNTER — Ambulatory Visit (HOSPITAL_COMMUNITY): Payer: Commercial Managed Care - HMO | Admitting: Anesthesiology

## 2015-07-26 ENCOUNTER — Encounter (HOSPITAL_COMMUNITY)
Admission: RE | Disposition: A | Discharge status: Home / Self Care | Payer: Self-pay | Source: Ambulatory Visit | Attending: Orthopaedic Surgery

## 2015-07-26 ENCOUNTER — Ambulatory Visit (HOSPITAL_COMMUNITY): Payer: Commercial Managed Care - HMO

## 2015-07-26 ENCOUNTER — Observation Stay (HOSPITAL_COMMUNITY)
Admission: RE | Admit: 2015-07-26 | Discharge: 2015-07-27 | Disposition: A | Discharge status: Home / Self Care | Payer: Commercial Managed Care - HMO | Source: Ambulatory Visit | Attending: Orthopaedic Surgery | Admitting: Orthopaedic Surgery

## 2015-07-26 DIAGNOSIS — M1990 Unspecified osteoarthritis, unspecified site: Secondary | ICD-10-CM | POA: Diagnosis not present

## 2015-07-26 DIAGNOSIS — I10 Essential (primary) hypertension: Secondary | ICD-10-CM | POA: Insufficient documentation

## 2015-07-26 DIAGNOSIS — M5126 Other intervertebral disc displacement, lumbar region: Secondary | ICD-10-CM | POA: Diagnosis not present

## 2015-07-26 DIAGNOSIS — M519 Unspecified thoracic, thoracolumbar and lumbosacral intervertebral disc disorder: Secondary | ICD-10-CM | POA: Diagnosis not present

## 2015-07-26 DIAGNOSIS — M5116 Intervertebral disc disorders with radiculopathy, lumbar region: Principal | ICD-10-CM | POA: Insufficient documentation

## 2015-07-26 DIAGNOSIS — Z9889 Other specified postprocedural states: Secondary | ICD-10-CM

## 2015-07-26 DIAGNOSIS — Z96651 Presence of right artificial knee joint: Secondary | ICD-10-CM | POA: Diagnosis not present

## 2015-07-26 DIAGNOSIS — Z419 Encounter for procedure for purposes other than remedying health state, unspecified: Secondary | ICD-10-CM

## 2015-07-26 HISTORY — PX: LUMBAR LAMINECTOMY/DECOMPRESSION MICRODISCECTOMY: SHX5026

## 2015-07-26 LAB — GLUCOSE, CAPILLARY: GLUCOSE-CAPILLARY: 191 mg/dL — AB (ref 65–99)

## 2015-07-26 SURGERY — LUMBAR LAMINECTOMY/DECOMPRESSION MICRODISCECTOMY
Anesthesia: General

## 2015-07-26 MED ORDER — DEXTROSE 5 % IV SOLN
500.0000 mg | Freq: Four times a day (QID) | INTRAVENOUS | Status: DC | PRN
  Filled 2015-07-26: qty 5

## 2015-07-26 MED ORDER — BUPIVACAINE HCL (PF) 0.25 % IJ SOLN
INTRAMUSCULAR | Status: AC
  Filled 2015-07-26: qty 30

## 2015-07-26 MED ORDER — PROPOFOL 10 MG/ML IV BOLUS
INTRAVENOUS | Status: DC | PRN
  Administered 2015-07-26: 100 mg via INTRAVENOUS
  Administered 2015-07-26 (×2): 50 mg via INTRAVENOUS

## 2015-07-26 MED ORDER — ONDANSETRON HCL 4 MG/2ML IJ SOLN
INTRAMUSCULAR | Status: AC
  Filled 2015-07-26: qty 2

## 2015-07-26 MED ORDER — DEXAMETHASONE SODIUM PHOSPHATE 10 MG/ML IJ SOLN
INTRAMUSCULAR | Status: DC | PRN
  Administered 2015-07-26: 10 mg via INTRAVENOUS

## 2015-07-26 MED ORDER — DOCUSATE SODIUM 100 MG PO CAPS
100.0000 mg | ORAL_CAPSULE | Freq: Two times a day (BID) | ORAL | Status: DC
  Administered 2015-07-26 – 2015-07-27 (×2): 100 mg via ORAL
  Filled 2015-07-26 (×2): qty 1

## 2015-07-26 MED ORDER — AMLODIPINE BESY-BENAZEPRIL HCL 5-10 MG PO CAPS: 1.0000 | ORAL_CAPSULE | Freq: Every day | ORAL | Status: DC

## 2015-07-26 MED ORDER — CEFAZOLIN SODIUM-DEXTROSE 2-4 GM/100ML-% IV SOLN
INTRAVENOUS | Status: AC
  Filled 2015-07-26: qty 100

## 2015-07-26 MED ORDER — DEXTROSE 5 % IV SOLN
2.0000 g | INTRAVENOUS | Status: AC
  Administered 2015-07-26: 2 g via INTRAVENOUS
  Filled 2015-07-26: qty 20

## 2015-07-26 MED ORDER — HYDROMORPHONE HCL 1 MG/ML IJ SOLN: 0.2500 mg | INTRAMUSCULAR | Status: DC | PRN

## 2015-07-26 MED ORDER — LACTATED RINGERS IV SOLN
INTRAVENOUS | Status: DC | PRN
  Administered 2015-07-26 (×2): via INTRAVENOUS

## 2015-07-26 MED ORDER — EPHEDRINE SULFATE 50 MG/ML IJ SOLN
INTRAMUSCULAR | Status: AC
  Filled 2015-07-26: qty 1

## 2015-07-26 MED ORDER — SODIUM CHLORIDE 0.9 % IV SOLN: 250.0000 mL | INTRAVENOUS | Status: DC

## 2015-07-26 MED ORDER — PHENYLEPHRINE 40 MCG/ML (10ML) SYRINGE FOR IV PUSH (FOR BLOOD PRESSURE SUPPORT)
PREFILLED_SYRINGE | INTRAVENOUS | Status: AC
  Filled 2015-07-26: qty 10

## 2015-07-26 MED ORDER — CHLORHEXIDINE GLUCONATE 4 % EX LIQD: 60.0000 mL | Freq: Once | CUTANEOUS | Status: DC

## 2015-07-26 MED ORDER — LIDOCAINE HCL (CARDIAC) 20 MG/ML IV SOLN
INTRAVENOUS | Status: DC | PRN
  Administered 2015-07-26: 50 mg via INTRAVENOUS

## 2015-07-26 MED ORDER — OXYCODONE-ACETAMINOPHEN 5-325 MG PO TABS: 1.0000 | ORAL_TABLET | Freq: Four times a day (QID) | ORAL | Status: AC | PRN

## 2015-07-26 MED ORDER — HYDROMORPHONE HCL 1 MG/ML IJ SOLN
0.5000 mg | INTRAMUSCULAR | Status: DC | PRN
  Administered 2015-07-26: 0.5 mg via INTRAVENOUS
  Filled 2015-07-26: qty 1

## 2015-07-26 MED ORDER — PROMETHAZINE HCL 25 MG/ML IJ SOLN: 6.2500 mg | INTRAMUSCULAR | Status: DC | PRN

## 2015-07-26 MED ORDER — ONDANSETRON HCL 4 MG/2ML IJ SOLN: 4.0000 mg | INTRAMUSCULAR | Status: DC | PRN

## 2015-07-26 MED ORDER — ROCURONIUM BROMIDE 50 MG/5ML IV SOLN
INTRAVENOUS | Status: AC
  Filled 2015-07-26: qty 1

## 2015-07-26 MED ORDER — PROPOFOL 10 MG/ML IV BOLUS
INTRAVENOUS | Status: AC
  Filled 2015-07-26: qty 20

## 2015-07-26 MED ORDER — OXYCODONE-ACETAMINOPHEN 5-325 MG PO TABS
1.0000 | ORAL_TABLET | Freq: Four times a day (QID) | ORAL | Status: DC | PRN
  Administered 2015-07-26 – 2015-07-27 (×2): 1 via ORAL
  Filled 2015-07-26 (×2): qty 1

## 2015-07-26 MED ORDER — SODIUM CHLORIDE 0.9% FLUSH
3.0000 mL | Freq: Two times a day (BID) | INTRAVENOUS | Status: DC
  Administered 2015-07-26 – 2015-07-27 (×3): 3 mL via INTRAVENOUS

## 2015-07-26 MED ORDER — SODIUM CHLORIDE 0.45 % IV SOLN
INTRAVENOUS | Status: DC
  Administered 2015-07-26: 15:00:00 via INTRAVENOUS

## 2015-07-26 MED ORDER — PHENOL 1.4 % MT LIQD: 1.0000 | OROMUCOSAL | Status: DC | PRN

## 2015-07-26 MED ORDER — ONDANSETRON HCL 4 MG/2ML IJ SOLN
INTRAMUSCULAR | Status: DC | PRN
  Administered 2015-07-26: 4 mg via INTRAVENOUS

## 2015-07-26 MED ORDER — MIDAZOLAM HCL 2 MG/2ML IJ SOLN
INTRAMUSCULAR | Status: AC
  Filled 2015-07-26: qty 2

## 2015-07-26 MED ORDER — ROCURONIUM BROMIDE 100 MG/10ML IV SOLN
INTRAVENOUS | Status: DC | PRN
  Administered 2015-07-26: 50 mg via INTRAVENOUS

## 2015-07-26 MED ORDER — AMLODIPINE BESYLATE 5 MG PO TABS
5.0000 mg | ORAL_TABLET | Freq: Every day | ORAL | Status: DC
  Administered 2015-07-26 – 2015-07-27 (×2): 5 mg via ORAL
  Filled 2015-07-26 (×2): qty 1

## 2015-07-26 MED ORDER — FENTANYL CITRATE (PF) 100 MCG/2ML IJ SOLN
INTRAMUSCULAR | Status: DC | PRN
  Administered 2015-07-26 (×2): 50 ug via INTRAVENOUS
  Administered 2015-07-26: 100 ug via INTRAVENOUS
  Administered 2015-07-26: 50 ug via INTRAVENOUS
  Administered 2015-07-26: 100 ug via INTRAVENOUS
  Administered 2015-07-26: 50 ug via INTRAVENOUS

## 2015-07-26 MED ORDER — 0.9 % SODIUM CHLORIDE (POUR BTL) OPTIME
TOPICAL | Status: DC | PRN
  Administered 2015-07-26: 1000 mL

## 2015-07-26 MED ORDER — CEFAZOLIN SODIUM 1-5 GM-% IV SOLN
1.0000 g | Freq: Three times a day (TID) | INTRAVENOUS | Status: AC
  Administered 2015-07-26 (×2): 1 g via INTRAVENOUS
  Filled 2015-07-26 (×2): qty 50

## 2015-07-26 MED ORDER — MENTHOL 3 MG MT LOZG: 1.0000 | LOZENGE | OROMUCOSAL | Status: DC | PRN

## 2015-07-26 MED ORDER — ACETAMINOPHEN 325 MG PO TABS: 650.0000 mg | ORAL_TABLET | ORAL | Status: DC | PRN

## 2015-07-26 MED ORDER — SUGAMMADEX SODIUM 200 MG/2ML IV SOLN
INTRAVENOUS | Status: AC
  Filled 2015-07-26: qty 2

## 2015-07-26 MED ORDER — BENAZEPRIL HCL 20 MG PO TABS
10.0000 mg | ORAL_TABLET | Freq: Every day | ORAL | Status: DC
Start: 2015-07-26 — End: 2015-07-27
  Administered 2015-07-26 – 2015-07-27 (×2): 10 mg via ORAL
  Filled 2015-07-26 (×2): qty 1

## 2015-07-26 MED ORDER — MIDAZOLAM HCL 5 MG/5ML IJ SOLN
INTRAMUSCULAR | Status: DC | PRN
  Administered 2015-07-26: 2 mg via INTRAVENOUS

## 2015-07-26 MED ORDER — POLYETHYLENE GLYCOL 3350 17 G PO PACK: 17.0000 g | PACK | Freq: Every day | ORAL | Status: DC | PRN

## 2015-07-26 MED ORDER — FENTANYL CITRATE (PF) 250 MCG/5ML IJ SOLN
INTRAMUSCULAR | Status: AC
  Filled 2015-07-26: qty 5

## 2015-07-26 MED ORDER — SUGAMMADEX SODIUM 200 MG/2ML IV SOLN
INTRAVENOUS | Status: DC | PRN
  Administered 2015-07-26: 200 mg via INTRAVENOUS

## 2015-07-26 MED ORDER — SIMVASTATIN 20 MG PO TABS
20.0000 mg | ORAL_TABLET | Freq: Every day | ORAL | Status: DC
  Filled 2015-07-26 (×2): qty 1

## 2015-07-26 MED ORDER — DEXAMETHASONE SODIUM PHOSPHATE 10 MG/ML IJ SOLN
INTRAMUSCULAR | Status: AC
  Filled 2015-07-26: qty 1

## 2015-07-26 MED ORDER — BUPIVACAINE HCL (PF) 0.25 % IJ SOLN
INTRAMUSCULAR | Status: DC | PRN
  Administered 2015-07-26: 15 mL

## 2015-07-26 MED ORDER — HYDROCODONE-ACETAMINOPHEN 7.5-325 MG PO TABS: 1.0000 | ORAL_TABLET | Freq: Once | ORAL | Status: DC | PRN

## 2015-07-26 MED ORDER — SODIUM CHLORIDE 0.9% FLUSH: 3.0000 mL | INTRAVENOUS | Status: DC | PRN

## 2015-07-26 MED ORDER — ACETAMINOPHEN 650 MG RE SUPP
650.0000 mg | RECTAL | Status: DC | PRN
Start: 2015-07-26 — End: 2015-07-27

## 2015-07-26 MED ORDER — METHOCARBAMOL 500 MG PO TABS
500.0000 mg | ORAL_TABLET | Freq: Four times a day (QID) | ORAL | Status: DC | PRN
  Administered 2015-07-27: 500 mg via ORAL
  Filled 2015-07-26: qty 1

## 2015-07-26 MED ORDER — METHOCARBAMOL 500 MG PO TABS: 500.0000 mg | ORAL_TABLET | Freq: Four times a day (QID) | ORAL | Status: AC | PRN

## 2015-07-26 MED ORDER — LIDOCAINE HCL (CARDIAC) 20 MG/ML IV SOLN
INTRAVENOUS | Status: AC
  Filled 2015-07-26: qty 5

## 2015-07-26 MED ORDER — SODIUM CHLORIDE 0.9 % IJ SOLN
INTRAMUSCULAR | Status: AC
  Filled 2015-07-26: qty 10

## 2015-07-26 SURGICAL SUPPLY — 49 items
ADH SKN CLS APL DERMABOND .7 (GAUZE/BANDAGES/DRESSINGS) ×2
APL SKNCLS STERI-STRIP NONHPOA (GAUZE/BANDAGES/DRESSINGS) ×1
BENZOIN TINCTURE PRP APPL 2/3 (GAUZE/BANDAGES/DRESSINGS) ×2 IMPLANT
BUR ROUND FLUTED 4 SOFT TCH (BURR) ×1 IMPLANT
BUR ROUND FLUTED 4MM SOFT TCH (BURR) ×1
CLOSURE STERI-STRIP 1/2X4 (GAUZE/BANDAGES/DRESSINGS) ×1
CLSR STERI-STRIP ANTIMIC 1/2X4 (GAUZE/BANDAGES/DRESSINGS) ×2 IMPLANT
COVER SURGICAL LIGHT HANDLE (MISCELLANEOUS) ×3 IMPLANT
DERMABOND ADVANCED (GAUZE/BANDAGES/DRESSINGS) ×4
DERMABOND ADVANCED .7 DNX12 (GAUZE/BANDAGES/DRESSINGS) ×1 IMPLANT
DRAPE MICROSCOPE LEICA (MISCELLANEOUS) ×3 IMPLANT
DRAPE PROXIMA HALF (DRAPES) ×6 IMPLANT
DRSG MEPILEX BORDER 4X4 (GAUZE/BANDAGES/DRESSINGS) ×3 IMPLANT
DRSG MEPILEX BORDER 4X8 (GAUZE/BANDAGES/DRESSINGS) ×3 IMPLANT
DURAPREP 26ML APPLICATOR (WOUND CARE) ×3 IMPLANT
ELECT REM PT RETURN 9FT ADLT (ELECTROSURGICAL) ×3
ELECTRODE REM PT RTRN 9FT ADLT (ELECTROSURGICAL) ×1 IMPLANT
GLOVE BIOGEL PI IND STRL 8 (GLOVE) ×2 IMPLANT
GLOVE BIOGEL PI INDICATOR 8 (GLOVE) ×4
GLOVE ORTHO TXT STRL SZ7.5 (GLOVE) ×6 IMPLANT
GOWN STRL REUS W/ TWL LRG LVL3 (GOWN DISPOSABLE) ×2 IMPLANT
GOWN STRL REUS W/ TWL XL LVL3 (GOWN DISPOSABLE) ×1 IMPLANT
GOWN STRL REUS W/TWL 2XL LVL3 (GOWN DISPOSABLE) ×3 IMPLANT
GOWN STRL REUS W/TWL LRG LVL3 (GOWN DISPOSABLE) ×6
GOWN STRL REUS W/TWL XL LVL3 (GOWN DISPOSABLE) ×3
KIT BASIN OR (CUSTOM PROCEDURE TRAY) ×3 IMPLANT
KIT ROOM TURNOVER OR (KITS) ×3 IMPLANT
MANIFOLD NEPTUNE II (INSTRUMENTS) ×3 IMPLANT
NDL HYPO 25GX1X1/2 BEV (NEEDLE) ×1 IMPLANT
NDL SPNL 18GX3.5 QUINCKE PK (NEEDLE) ×1 IMPLANT
NEEDLE HYPO 25GX1X1/2 BEV (NEEDLE) ×3 IMPLANT
NEEDLE SPNL 18GX3.5 QUINCKE PK (NEEDLE) ×3 IMPLANT
NS IRRIG 1000ML POUR BTL (IV SOLUTION) ×3 IMPLANT
PACK LAMINECTOMY ORTHO (CUSTOM PROCEDURE TRAY) ×3 IMPLANT
PAD ARMBOARD 7.5X6 YLW CONV (MISCELLANEOUS) ×6 IMPLANT
PATTIES SURGICAL .5 X.5 (GAUZE/BANDAGES/DRESSINGS) IMPLANT
PATTIES SURGICAL .75X.75 (GAUZE/BANDAGES/DRESSINGS) IMPLANT
SUT BONE WAX W31G (SUTURE) IMPLANT
SUT VIC AB 0 CT1 27 (SUTURE) ×3
SUT VIC AB 0 CT1 27XBRD ANBCTR (SUTURE) ×1 IMPLANT
SUT VIC AB 1 CTX 36 (SUTURE) ×6
SUT VIC AB 1 CTX36XBRD ANBCTR (SUTURE) IMPLANT
SUT VIC AB 2-0 CT1 27 (SUTURE) ×3
SUT VIC AB 2-0 CT1 TAPERPNT 27 (SUTURE) ×1 IMPLANT
SUT VIC AB 3-0 X1 27 (SUTURE) IMPLANT
SYRINGE 10CC LL (SYRINGE) ×2 IMPLANT
TOWEL OR 17X24 6PK STRL BLUE (TOWEL DISPOSABLE) ×3 IMPLANT
TOWEL OR 17X26 10 PK STRL BLUE (TOWEL DISPOSABLE) ×3 IMPLANT
WATER STERILE IRR 1000ML POUR (IV SOLUTION) ×3 IMPLANT

## 2015-07-26 NOTE — Anesthesia Procedure Notes (Signed)
Procedure Name: Intubation Date/Time: 07/26/2015 7:44 AM Performed by: Ferol LuzMCMILLEN, Ameet Sandy L Pre-anesthesia Checklist: Patient identified, Emergency Drugs available, Suction available and Patient being monitored Patient Re-evaluated:Patient Re-evaluated prior to inductionOxygen Delivery Method: Circle System Utilized Preoxygenation: Pre-oxygenation with 100% oxygen Intubation Type: IV induction Ventilation: Mask ventilation without difficulty and Oral airway inserted - appropriate to patient size Laryngoscope Size: Mac, 3, Miller and 2 Tube type: Oral Tube size: 7.5 mm Number of attempts: 2 Airway Equipment and Method: Stylet and Oral airway Placement Confirmation: ETT inserted through vocal cords under direct vision,  positive ETCO2 and breath sounds checked- equal and bilateral Secured at: 20 cm Tube secured with: Tape Dental Injury: Teeth and Oropharynx as per pre-operative assessment  Difficulty Due To: Difficult Airway- due to limited oral opening Comments: 1st DL c MAC #3 unable to obtain adequate view due to small mouth and floppy epiglottis.  2nd DL c Miller#2 able to move epiglottis well and obtain grade 2 view  Slight trauma to posterior pharynx c og tube insertion

## 2015-07-26 NOTE — Op Note (Signed)
[name], [name] NO.:  [id number]  MEDICAL RECORD NO.:  [id number]  LOCATION:  [facility]                         FACILITY:  [facility]  PHYSICIAN:  Raequon Catanzaro C. Ophelia Charter, M.D.    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  POSTOPERATIVE DIAGNOSIS:  Left L3-4, left L4-5 herniated nucleus pulposus with radiculopathy.  PROCEDURE:  Left L3-4, L4-5 microdiskectomy, removal of herniated nucleus pulposus.  Laminotomy at L3, L4, and L5.  SURGEON:  Ollen Rao C. Ophelia Charter, M.D.  ASSISTANT:  Genene Churn. Barry Dienes, PA-C, medically necessary and present for the entire procedure.  ANESTHESIA:  CO2 plus Marcaine local.  INDICATIONS:  This 75 year old female, who has had persistent problems with large HNP at L3-4 on the left with pseudo stenosis and radiculopathy.  She failed conservative treatment and having to ambulate with a walker.  She cannot walk without it due to leg weakness.  She has a large disk herniation at L3-4 and small to moderate size at L4-5 broad based.  DESCRIPTION OF PROCEDURE:  After induction of general anesthesia, orotracheal intubation, the patient placed prone on chest rolls with careful padding, positioning, rolled yellow foam pads underneath her axilla.  A 10/10 drape was applied, DuraPrep.  Time-out procedure completed.  Area squared with towels, Ancef prophylaxis.  Betadine, Steri-Drape applied, laminectomy sheets and drape.  Needle localization with spinal needle.  Incision was adjusted based on the intraoperative x-ray, and once subperiosteal dissection out onto the lamina at L3 and L4 was well visualized, a Penfield 4 was placed underneath the inferior aspect of the lamina on the left at L3 and a Therapist, nutritional at L4 confirmed with a second x-ray.  Bur was used to thin the lamina at L3 and  L4.  Top portion of L5 was also trimmed with a Kerrison rongeur. There was hypertrophic ligamentum in the L3-4 level where the disk rupture was large and was surgically addressed first.  Operative microscope was draped, brought in and ligament was peeled off. Overhanging spurs were removed.  There was large disk herniation with some fragments that had extruded that were suctioned up and removed with a sucker with micropituitary.  D'Errico was used to protect the dura. Some veins in the lateral gutter were coagulated with a long bipolar. Anulus was incised and only the micro pituitary would fit into the disk space due to endplate spurring.  Disk fragments were cleaned out which was left paracentral.  Up and down micro pituitaries, hockey stick, ball- tipped nerve hook were all used as well as Epsteins to push fragments down and then remove them until it was decompressed.  Lateral recess was free, the foramina was opened up.  The top portion of lamina of L4 was removed as well as some ligamentum laterally.  A patty was placed once the nerve was completely decompressed.  Attention was turned to the lower level.  At this level, L4-5 lamina was bent a little bit at the top of L5 for removal of some bone from the third vertebral segment. Bone was  removed from L3, L4 and L5.  Ligaments were moved laterally and there was a broad-based disk bulge not as prominent.  Anulus was incised.  Passes were made with micropituitary.  Lateral recess was decompressed.  Epstein was used to push down fragments toward the middle and removed to help decompress.  This 4-5 level did not show the severe compression that was present at L3-4 level.  Nerve root was free as it went underneath the pedicle, top portion of L5 was trimmed back and some ligament removed as well as bone at L5 to make sure the nerve root was free.  Both areas were irrigated.  Operative field was dry.  Vein in the lateral gutter at L4-5 was  coagulated which was one large vein. Irrigation with saline solution.  Closure with #1 Vicryl, 2-0 Vicryl in subcutaneous tissue, subcuticular closure, Dermabond on the skin. Postop dressing and transferred to recovery room.  Instrument count and needle count were correct.     [name], M.D.     MCY/MEDQ  D:  07/26/2015  T:  07/26/2015  Job:  [id number]

## 2015-07-26 NOTE — Interval H&P Note (Signed)
History and Physical Interval Note:  07/26/2015 7:24 AM  [name]  has presented today for surgery, with the diagnosis of Left L3-4 , Left L4-5 Herniated Nucleus Pulposus  The various methods of treatment have been discussed with the patient and family. After consideration of risks, benefits and other options for treatment, the patient has consented to  Procedure(s): Left L3-4, L4-5 Microdiscectomy (N/A) as a surgical intervention .  The patient's history has been reviewed, patient examined, no change in status, stable for surgery.  I have reviewed the patient's chart and labs.  Questions were answered to the patient's satisfaction.     Arjun Hard C

## 2015-07-26 NOTE — Anesthesia Postprocedure Evaluation (Signed)
Anesthesia Post Note  Patient: Morgan CriglerRoberta L Bodiford  Procedure(s) Performed: Procedure(s) (LRB): Left L3-4, L4-5 Microdiscectomy (N/A)  Patient location during evaluation: PACU Anesthesia Type: General Level of consciousness: awake and alert Pain management: pain level controlled Vital Signs Assessment: post-procedure vital signs reviewed and stable Respiratory status: spontaneous breathing, nonlabored ventilation, respiratory function stable and patient connected to nasal cannula oxygen Cardiovascular status: blood pressure returned to baseline and stable Postop Assessment: no signs of nausea or vomiting Anesthetic complications: no    Last Vitals:  Filed Vitals:   07/26/15 1215 07/26/15 1254  BP: 156/80 144/77  Pulse: 80 85  Temp: 36.7 C 37.1 C  Resp: 10 17    Last Pain:  Filed Vitals:   07/26/15 1255  PainSc: 3                  Kennieth RadFitzgerald, Tanor Glaspy E

## 2015-07-26 NOTE — Brief Op Note (Signed)
07/26/2015  9:51 AM  PATIENT:  [name]  75 y.o. female  PRE-OPERATIVE DIAGNOSIS:  Left L3-4 , Left L4-5 Herniated Nucleus Pulposus  POST-OPERATIVE DIAGNOSIS:  Left L3-4 , Left L4-5 Herniated Nucleus Pulposus  PROCEDURE:  Procedure(s): Left L3-4, L4-5 Microdiscectomy (N/A)  SURGEON:  Surgeon(s) and Role:    * Eldred MangesMark C Yates, MD - Primary  PHYSICIAN ASSISTANT: Zonia Kiefjames Conor Lata pa-c   ANESTHESIA:   general  EBL:  Total I/O In: 1300 [I.V.:1300] Out: -   BLOOD ADMINISTERED:none  DRAINS: none   LOCAL MEDICATIONS USED:  MARCAINE     SPECIMEN:  No Specimen  DISPOSITION OF SPECIMEN:  N/A  COUNTS:  YES  TOURNIQUET:  * No tourniquets in log *  DICTATION: .Dragon Dictation  PLAN OF CARE: Admit for overnight observation  PATIENT DISPOSITION:  PACU - hemodynamically stable.

## 2015-07-26 NOTE — Transfer of Care (Signed)
Immediate Anesthesia Transfer of Care Note  Patient: [name]  Procedure(s) Performed: Procedure(s): Left L3-4, L4-5 Microdiscectomy (N/A)  Patient Location: PACU  Anesthesia Type:General  Level of Consciousness: awake, alert , oriented and patient cooperative  Airway & Oxygen Therapy: Patient Spontanous Breathing and Patient connected to nasal cannula oxygen  Post-op Assessment: Report given to RN, Post -op Vital signs reviewed and stable and Patient moving all extremities  Post vital signs: Reviewed and stable  Last Vitals:  Filed Vitals:   07/26/15 0614  BP: 167/64  Pulse: 86  Temp: 36.6 C  Resp: 20    Complications: No apparent anesthesia complications

## 2015-07-26 NOTE — Anesthesia Preprocedure Evaluation (Addendum)
Anesthesia Evaluation  Patient identified by MRN, date of birth, ID band Patient awake    Reviewed: Allergy & Precautions, NPO status , Patient's Chart, lab work & pertinent test results  Airway Mallampati: III  TM Distance: >3 FB Neck ROM: Full  Mouth opening: Limited Mouth Opening  Dental  (+) Dental Advisory Given   Pulmonary neg pulmonary ROS,    breath sounds clear to auscultation       Cardiovascular hypertension, Pt. on medications (-) angina(-) DOE  Rhythm:Regular Rate:Normal + Systolic murmurs (Grade II/VI)    Neuro/Psych negative neurological ROS     GI/Hepatic negative GI ROS, Neg liver ROS,   Endo/Other  negative endocrine ROS  Renal/GU negative Renal ROS     Musculoskeletal  (+) Arthritis ,   Abdominal   Peds  Hematology negative hematology ROS (+)   Anesthesia Other Findings   Reproductive/Obstetrics                            Lab Results  Component Value Date   WBC 7.4 07/18/2015   HGB 13.3 07/18/2015   HCT 40.4 07/18/2015   MCV 93.3 07/18/2015   PLT 221 07/18/2015   Lab Results  Component Value Date   CREATININE 0.69 07/18/2015   BUN 7 07/18/2015   NA 141 07/18/2015   K 4.1 07/18/2015   CL 103 07/18/2015   CO2 22 07/18/2015     Anesthesia Physical Anesthesia Plan  ASA: III  Anesthesia Plan: General   Post-op Pain Management:    Induction: Intravenous  Airway Management Planned: Oral ETT  Additional Equipment:   Intra-op Plan:   Post-operative Plan: Extubation in OR  Informed Consent: I have reviewed the patients History and Physical, chart, labs and discussed the procedure including the risks, benefits and alternatives for the proposed anesthesia with the patient or authorized representative who has indicated his/her understanding and acceptance.   Dental advisory given  Plan Discussed with: CRNA  Anesthesia Plan Comments:         Anesthesia Quick Evaluation

## 2015-07-27 DIAGNOSIS — M5116 Intervertebral disc disorders with radiculopathy, lumbar region: Secondary | ICD-10-CM | POA: Diagnosis not present

## 2015-07-27 DIAGNOSIS — Z96651 Presence of right artificial knee joint: Secondary | ICD-10-CM | POA: Diagnosis not present

## 2015-07-27 DIAGNOSIS — I10 Essential (primary) hypertension: Secondary | ICD-10-CM | POA: Diagnosis not present

## 2015-07-27 LAB — HEMOGLOBIN A1C
HEMOGLOBIN A1C: 9.2 % — AB (ref 4.8–5.6)
MEAN PLASMA GLUCOSE: 217 mg/dL

## 2015-07-27 NOTE — Progress Notes (Signed)
Discharge instructions given. Pt verbalized understanding and all questions were answered.  

## 2015-07-27 NOTE — Progress Notes (Signed)
Patient is stable sitting on side of bed.  Dressing is c/d/i. Ambulates with cane.  Stable for dc home today. Rx in chart  N. Glee ArvinMichael Xu, MD [facility] [phone] 8:47 AM

## 2015-07-27 NOTE — Care Management Note (Addendum)
Case Management Note  Patient Details  Name: [name] MRN: [id number] Date of Birth: 01-04-1941  Subjective/Objective:   75 yo F s/p L L3-4, L4-5 microdiskectomy, removal of herniated nucleus pulposus. Laminotomy at L3, L4, and L5.   Action/Plan: received referral to assist with [facility] and DME as needed   Expected Discharge Date:    07/27/15              Expected Discharge Plan:  Home/Self Care  In-House Referral:     Discharge planning Services  CM Consult  Post Acute Care Choice:  Durable Medical Equipment Choice offered to:     DME Arranged:  [place] rolling DME Agency:  [place]:    [place]:     Status of Service:  Completed, signed off  Medicare Important Message Given:    Date Medicare IM Given:    Medicare IM give by:    Date Additional Medicare IM Given:    Additional Medicare Important Message give by:     If discussed at [place] of Stay Meetings, dates discussed:    Additional Comments: met with pt and husband. Pt plans to return home with the support of her husband. She has a cane. Husband reports that the doctor informed them that she can get a RW. Mellody Dance at [facility] for DME referral. RW delivered.  Norina Buzzard, RN 07/27/2015, 10:13 AM

## 2015-07-29 ENCOUNTER — Encounter (HOSPITAL_COMMUNITY): Payer: Self-pay | Admitting: Orthopaedic Surgery

## 2015-07-29 NOTE — Discharge Summary (Signed)
Patient ID: [name] [place] MRN: [id number] DOB/AGE: 11/06/1940 75 y.o.  Admit date: 07/26/2015 Discharge date: 07/29/2015  Admission Diagnoses:  Active Problems:   S/P lumbar discectomy   Discharge Diagnoses:  Active Problems:   S/P lumbar discectomy  status post Procedure(s): Left L3-4, L4-5 Microdiscectomy  Past Medical History  Diagnosis Date  . Hypertension   . Arthritis   . High cholesterol   . Pancreatitis   . PONV (postoperative nausea and vomiting)     after knee surgery    Surgeries: Procedure(s): Left L3-4, L4-5 Microdiscectomy on 07/26/2015   Consultants:    Discharged Condition: Improved  Hospital Course: [name] Cott is an 75 y.o. female who was admitted 07/26/2015 for operative treatment of lumbar HNP. Patient failed conservative treatments (please see the history and physical for the specifics) and had severe unremitting pain that affects sleep, daily activities and work/hobbies. After pre-op clearance, the patient was taken to the operating room on 07/26/2015 and underwent  Procedure(s): Left L3-4, L4-5 Microdiscectomy.    Patient was given perioperative antibiotics:  Anti-infectives    Start     Dose/Rate Route Frequency Ordered Stop   07/26/15 1530  ceFAZolin (ANCEF) IVPB 1 g/50 mL premix     1 g 100 mL/hr over 30 Minutes Intravenous Every 8 hours 07/26/15 1300 07/26/15 2230   07/26/15 0640  ceFAZolin (ANCEF) 2-4 GM/100ML-% IVPB    Comments:  [place] LargePatterson, [place]   : cabinet override      07/26/15 0640 07/26/15 1844   07/26/15 0529  ceFAZolin (ANCEF) 2 g in dextrose 5 % 50 mL IVPB     2 g 140 mL/hr over 30 Minutes Intravenous On call to O.R. 07/26/15 [id number] 07/26/15 0805       Patient was given sequential compression devices and early ambulation to prevent DVT.   Patient benefited maximally from hospital stay and there were no complications. At the time of discharge, the patient was urinating/moving their bowels without difficulty, tolerating  a regular diet, pain is controlled with oral pain medications and they have been cleared by PT/OT.   Recent vital signs: No data found.    Recent laboratory studies: No results for input(s): WBC, HGB, HCT, PLT, NA, K, CL, CO2, BUN, CREATININE, GLUCOSE, INR, CALCIUM in the last 72 hours.  Invalid input(s): PT, 2   Discharge Medications:     Medication List    STOP taking these medications        acetaminophen 500 MG tablet  Commonly known as:  TYLENOL      TAKE these medications        amLODipine-benazepril 5-10 MG capsule  Commonly known as:  LOTREL  Take 1 capsule by mouth daily.     methocarbamol 500 MG tablet  Commonly known as:  ROBAXIN  Take 1 tablet (500 mg total) by mouth every 6 (six) hours as needed for muscle spasms.     oxyCODONE-acetaminophen 5-325 MG tablet  Commonly known as:  PERCOCET/ROXICET  Take 1 tablet by mouth every 6 (six) hours as needed for moderate pain.     simvastatin 20 MG tablet  Commonly known as:  ZOCOR  Take 20 mg by mouth daily.        Diagnostic Studies: Dg Chest 2 View  07/18/2015  CLINICAL DATA:  Lumbar herniated nucleus pulposus. EXAM: CHEST  2 VIEW COMPARISON:  June 02, 2010. FINDINGS: The heart size and mediastinal contours are within normal limits. Both lungs are clear. No pneumothorax or  pleural effusion is noted. The visualized skeletal structures are unremarkable. IMPRESSION: No active cardiopulmonary disease. Electronically Signed   By: Lupita Raider, M.D.   On: 07/18/2015 15:56   Dg Lumbar Spine 2-3 Views  07/26/2015  CLINICAL DATA:  L3 through L5 discectomy EXAM: LUMBAR SPINE - 2-3 VIEW COMPARISON:  None. FINDINGS: The most inferior disc is labeled L5-S1. On the initial image, needles project over the L2-3 and L3-4 foramina. On the second image, surgical instruments project over the posterior elements at the L4 and L5 pedicle levels. IMPRESSION: Intraoperative localization at L3 through L5 as described. Electronically  Signed   By: Jolaine Click M.D.   On: 07/26/2015 11:31        Discharge Instructions    Call MD / Call 911    Complete by:  As directed   If you experience chest pain or shortness of breath, CALL 911 and be transported to the hospital emergency room.  If you develope a fever above 101 F, pus (white drainage) or increased drainage or redness at the wound, or calf pain, call your surgeon's office.     Call MD / Call 911    Complete by:  As directed   If you experience chest pain or shortness of breath, CALL 911 and be transported to the hospital emergency room.  If you develope a fever above 101.5 F, pus (white drainage) or increased drainage or redness at the wound, or calf pain, call your surgeon's office.     Constipation Prevention    Complete by:  As directed   Drink plenty of fluids.  Prune juice may be helpful.  You may use a stool softener, such as Colace (over the counter) 100 mg twice a day.  Use MiraLax (over the counter) for constipation as needed.     Constipation Prevention    Complete by:  As directed   Drink plenty of fluids.  Prune juice may be helpful.  You may use a stool softener, such as Colace (over the counter) 100 mg twice a day.  Use MiraLax (over the counter) for constipation as needed.     Diet - low sodium heart healthy    Complete by:  As directed      Diet - low sodium heart healthy    Complete by:  As directed      Diet general    Complete by:  As directed      Discharge instructions    Complete by:  As directed   Ok to shower 5 days postop.  Do not apply any creams or ointments to incision.  Do not remove steri-strips.  Can use 4x4 gauze and tape for dressing changes.  No aggressive activity.  No bending, squatting,twisting or prolonged sitting.  Mostly be in reclined position or lying down.     Driving restrictions    Complete by:  As directed   No driving     Driving restrictions    Complete by:  As directed   No driving while taking narcotic pain meds.      Increase activity slowly as tolerated    Complete by:  As directed      Increase activity slowly as tolerated    Complete by:  As directed      Lifting restrictions    Complete by:  As directed   No lifting           Follow-up Information    Schedule an appointment as soon  as possible for a visit with Eldred Manges, MD.   Specialty:  Orthopedic Surgery   Why:  need return office visit one week postop   Contact information:   [address] [place] [address] [place] [address] [phone]       Discharge Plan:  discharge to home  Disposition:     Signed: Naida Sleight 07/29/2015, 9:13 AM

## 2015-07-31 DIAGNOSIS — Z96659 Presence of unspecified artificial knee joint: Secondary | ICD-10-CM | POA: Diagnosis not present

## 2015-07-31 DIAGNOSIS — R269 Unspecified abnormalities of gait and mobility: Secondary | ICD-10-CM | POA: Diagnosis not present

## 2015-07-31 DIAGNOSIS — M179 Osteoarthritis of knee, unspecified: Secondary | ICD-10-CM | POA: Diagnosis not present

## 2016-04-14 DIAGNOSIS — E785 Hyperlipidemia, unspecified: Secondary | ICD-10-CM | POA: Diagnosis not present

## 2016-04-14 DIAGNOSIS — E1165 Type 2 diabetes mellitus with hyperglycemia: Secondary | ICD-10-CM | POA: Diagnosis not present

## 2016-04-14 DIAGNOSIS — E559 Vitamin D deficiency, unspecified: Secondary | ICD-10-CM | POA: Diagnosis not present

## 2016-04-14 DIAGNOSIS — E1142 Type 2 diabetes mellitus with diabetic polyneuropathy: Secondary | ICD-10-CM | POA: Diagnosis not present

## 2016-04-14 DIAGNOSIS — Z79899 Other long term (current) drug therapy: Secondary | ICD-10-CM | POA: Diagnosis not present

## 2016-05-06 DIAGNOSIS — J0100 Acute maxillary sinusitis, unspecified: Secondary | ICD-10-CM | POA: Diagnosis not present

## 2016-05-06 DIAGNOSIS — E1142 Type 2 diabetes mellitus with diabetic polyneuropathy: Secondary | ICD-10-CM | POA: Diagnosis not present

## 2016-05-06 DIAGNOSIS — J069 Acute upper respiratory infection, unspecified: Secondary | ICD-10-CM | POA: Diagnosis not present

## 2016-05-28 DIAGNOSIS — R319 Hematuria, unspecified: Secondary | ICD-10-CM | POA: Diagnosis not present

## 2016-05-28 DIAGNOSIS — N390 Urinary tract infection, site not specified: Secondary | ICD-10-CM | POA: Diagnosis not present

## 2016-06-22 DIAGNOSIS — Z1231 Encounter for screening mammogram for malignant neoplasm of breast: Secondary | ICD-10-CM | POA: Diagnosis not present

## 2016-08-12 DIAGNOSIS — E785 Hyperlipidemia, unspecified: Secondary | ICD-10-CM | POA: Diagnosis not present

## 2016-08-12 DIAGNOSIS — E559 Vitamin D deficiency, unspecified: Secondary | ICD-10-CM | POA: Diagnosis not present

## 2016-08-12 DIAGNOSIS — D692 Other nonthrombocytopenic purpura: Secondary | ICD-10-CM | POA: Diagnosis not present

## 2016-08-12 DIAGNOSIS — E1142 Type 2 diabetes mellitus with diabetic polyneuropathy: Secondary | ICD-10-CM | POA: Diagnosis not present

## 2016-08-12 DIAGNOSIS — Z0000 Encounter for general adult medical examination without abnormal findings: Secondary | ICD-10-CM | POA: Diagnosis not present

## 2016-08-12 DIAGNOSIS — I10 Essential (primary) hypertension: Secondary | ICD-10-CM | POA: Diagnosis not present

## 2016-08-12 DIAGNOSIS — Z96651 Presence of right artificial knee joint: Secondary | ICD-10-CM | POA: Diagnosis not present

## 2016-08-12 DIAGNOSIS — Z79899 Other long term (current) drug therapy: Secondary | ICD-10-CM | POA: Diagnosis not present

## 2016-08-20 DIAGNOSIS — Z1211 Encounter for screening for malignant neoplasm of colon: Secondary | ICD-10-CM | POA: Diagnosis not present

## 2016-09-24 DIAGNOSIS — N3000 Acute cystitis without hematuria: Secondary | ICD-10-CM | POA: Diagnosis not present

## 2016-09-24 DIAGNOSIS — E1165 Type 2 diabetes mellitus with hyperglycemia: Secondary | ICD-10-CM | POA: Diagnosis not present

## 2016-11-19 DIAGNOSIS — E1142 Type 2 diabetes mellitus with diabetic polyneuropathy: Secondary | ICD-10-CM | POA: Diagnosis not present

## 2016-11-19 DIAGNOSIS — I10 Essential (primary) hypertension: Secondary | ICD-10-CM | POA: Diagnosis not present

## 2017-02-24 DIAGNOSIS — R350 Frequency of micturition: Secondary | ICD-10-CM | POA: Diagnosis not present

## 2017-04-20 DIAGNOSIS — E1165 Type 2 diabetes mellitus with hyperglycemia: Secondary | ICD-10-CM | POA: Diagnosis not present

## 2017-06-28 DIAGNOSIS — Z1231 Encounter for screening mammogram for malignant neoplasm of breast: Secondary | ICD-10-CM | POA: Diagnosis not present

## 2017-07-07 DIAGNOSIS — M25519 Pain in unspecified shoulder: Secondary | ICD-10-CM | POA: Diagnosis not present

## 2017-07-07 DIAGNOSIS — M542 Cervicalgia: Secondary | ICD-10-CM | POA: Diagnosis not present

## 2017-08-26 DIAGNOSIS — E1165 Type 2 diabetes mellitus with hyperglycemia: Secondary | ICD-10-CM | POA: Diagnosis not present

## 2017-08-26 DIAGNOSIS — Z0000 Encounter for general adult medical examination without abnormal findings: Secondary | ICD-10-CM | POA: Diagnosis not present

## 2017-08-26 DIAGNOSIS — E2839 Other primary ovarian failure: Secondary | ICD-10-CM | POA: Diagnosis not present

## 2017-08-26 DIAGNOSIS — E785 Hyperlipidemia, unspecified: Secondary | ICD-10-CM | POA: Diagnosis not present

## 2017-08-26 DIAGNOSIS — Z1211 Encounter for screening for malignant neoplasm of colon: Secondary | ICD-10-CM | POA: Diagnosis not present

## 2017-08-26 DIAGNOSIS — Z79899 Other long term (current) drug therapy: Secondary | ICD-10-CM | POA: Diagnosis not present

## 2017-08-26 DIAGNOSIS — E1142 Type 2 diabetes mellitus with diabetic polyneuropathy: Secondary | ICD-10-CM | POA: Diagnosis not present

## 2017-08-26 DIAGNOSIS — E559 Vitamin D deficiency, unspecified: Secondary | ICD-10-CM | POA: Diagnosis not present

## 2017-09-29 DIAGNOSIS — Z1211 Encounter for screening for malignant neoplasm of colon: Secondary | ICD-10-CM | POA: Diagnosis not present

## 2017-10-19 DIAGNOSIS — M8588 Other specified disorders of bone density and structure, other site: Secondary | ICD-10-CM | POA: Diagnosis not present

## 2017-10-19 DIAGNOSIS — E2839 Other primary ovarian failure: Secondary | ICD-10-CM | POA: Diagnosis not present

## 2018-01-10 DIAGNOSIS — L723 Sebaceous cyst: Secondary | ICD-10-CM | POA: Diagnosis not present

## 2018-01-10 DIAGNOSIS — E119 Type 2 diabetes mellitus without complications: Secondary | ICD-10-CM | POA: Diagnosis not present

## 2018-01-24 DIAGNOSIS — N3000 Acute cystitis without hematuria: Secondary | ICD-10-CM | POA: Diagnosis not present

## 2018-02-15 IMAGING — CR DG CHEST 2V
2 series · 2 of 2 positions shown · non-contrast
Comparison: June 02, 2010.

CLINICAL DATA: Lumbar herniated nucleus pulposus.

EXAM:
CHEST  2 VIEW

[w chest pa]
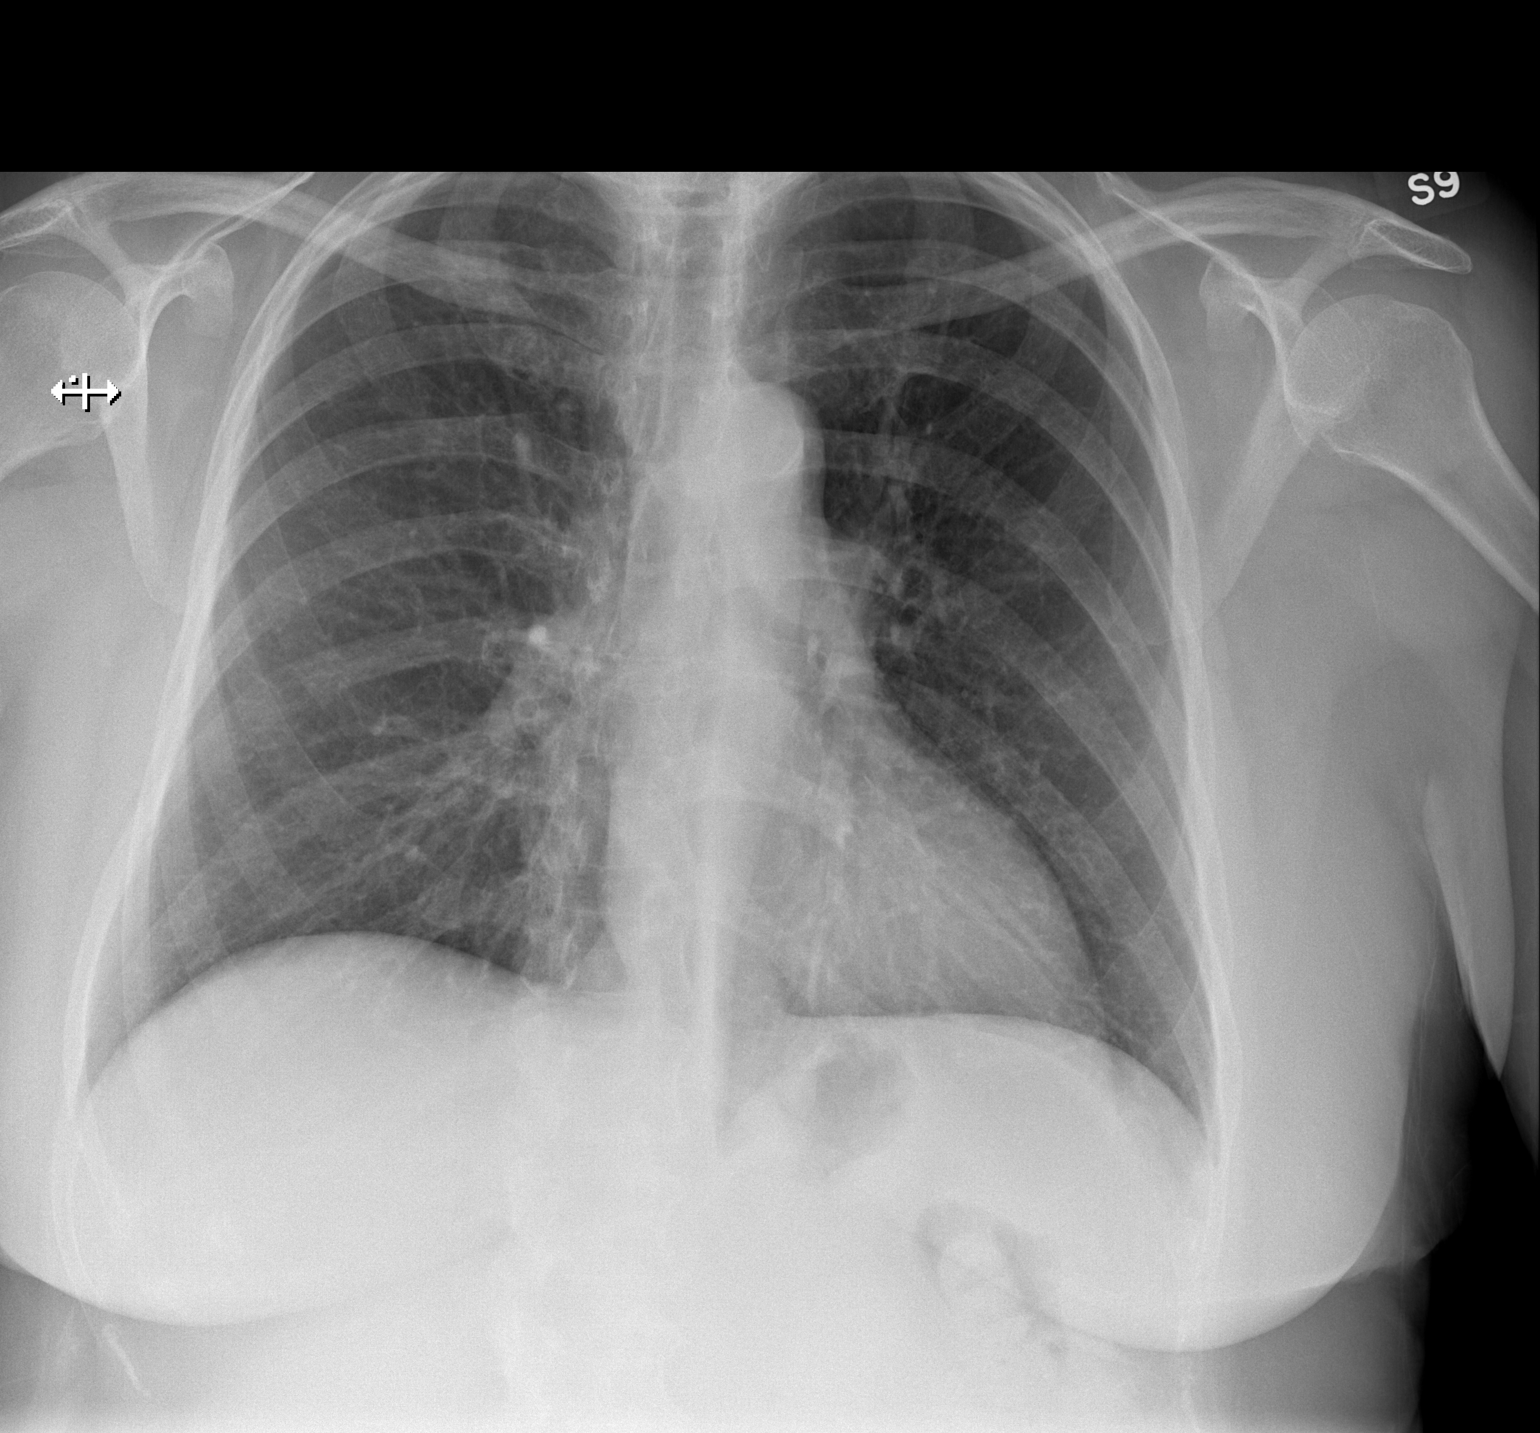

[w chest lat]
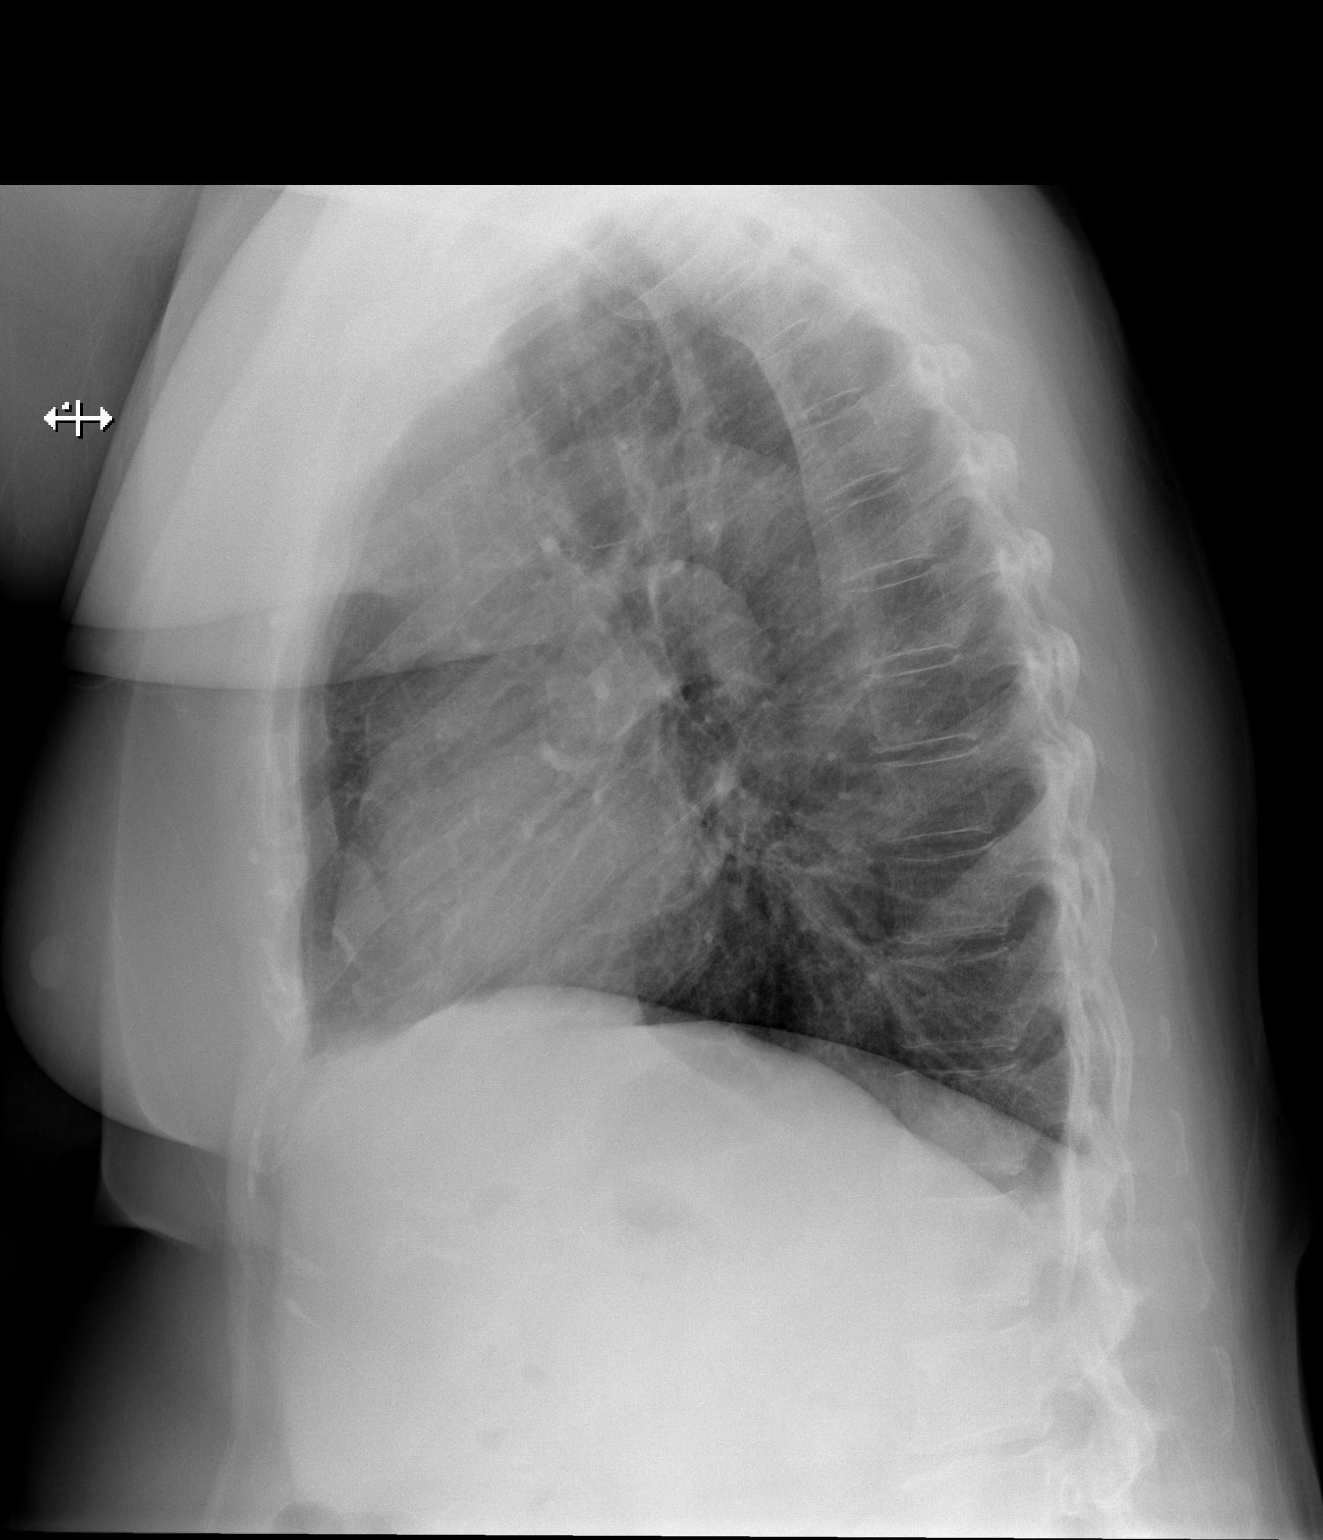

[2 of 2 positions shown; findings below may reference images not displayed]

FINDINGS: The heart size and mediastinal contours are within normal limits.
Both lungs are clear. No pneumothorax or pleural effusion is noted.
The visualized skeletal structures are unremarkable.
IMPRESSION: No active cardiopulmonary disease.

## 2018-02-23 IMAGING — CR DG LUMBAR SPINE 2-3V
1 series · 1 of 1 positions shown · non-contrast
Comparison: None.

CLINICAL DATA: L3 through L5 discectomy

EXAM:
LUMBAR SPINE - 2-3 VIEW

[AP]
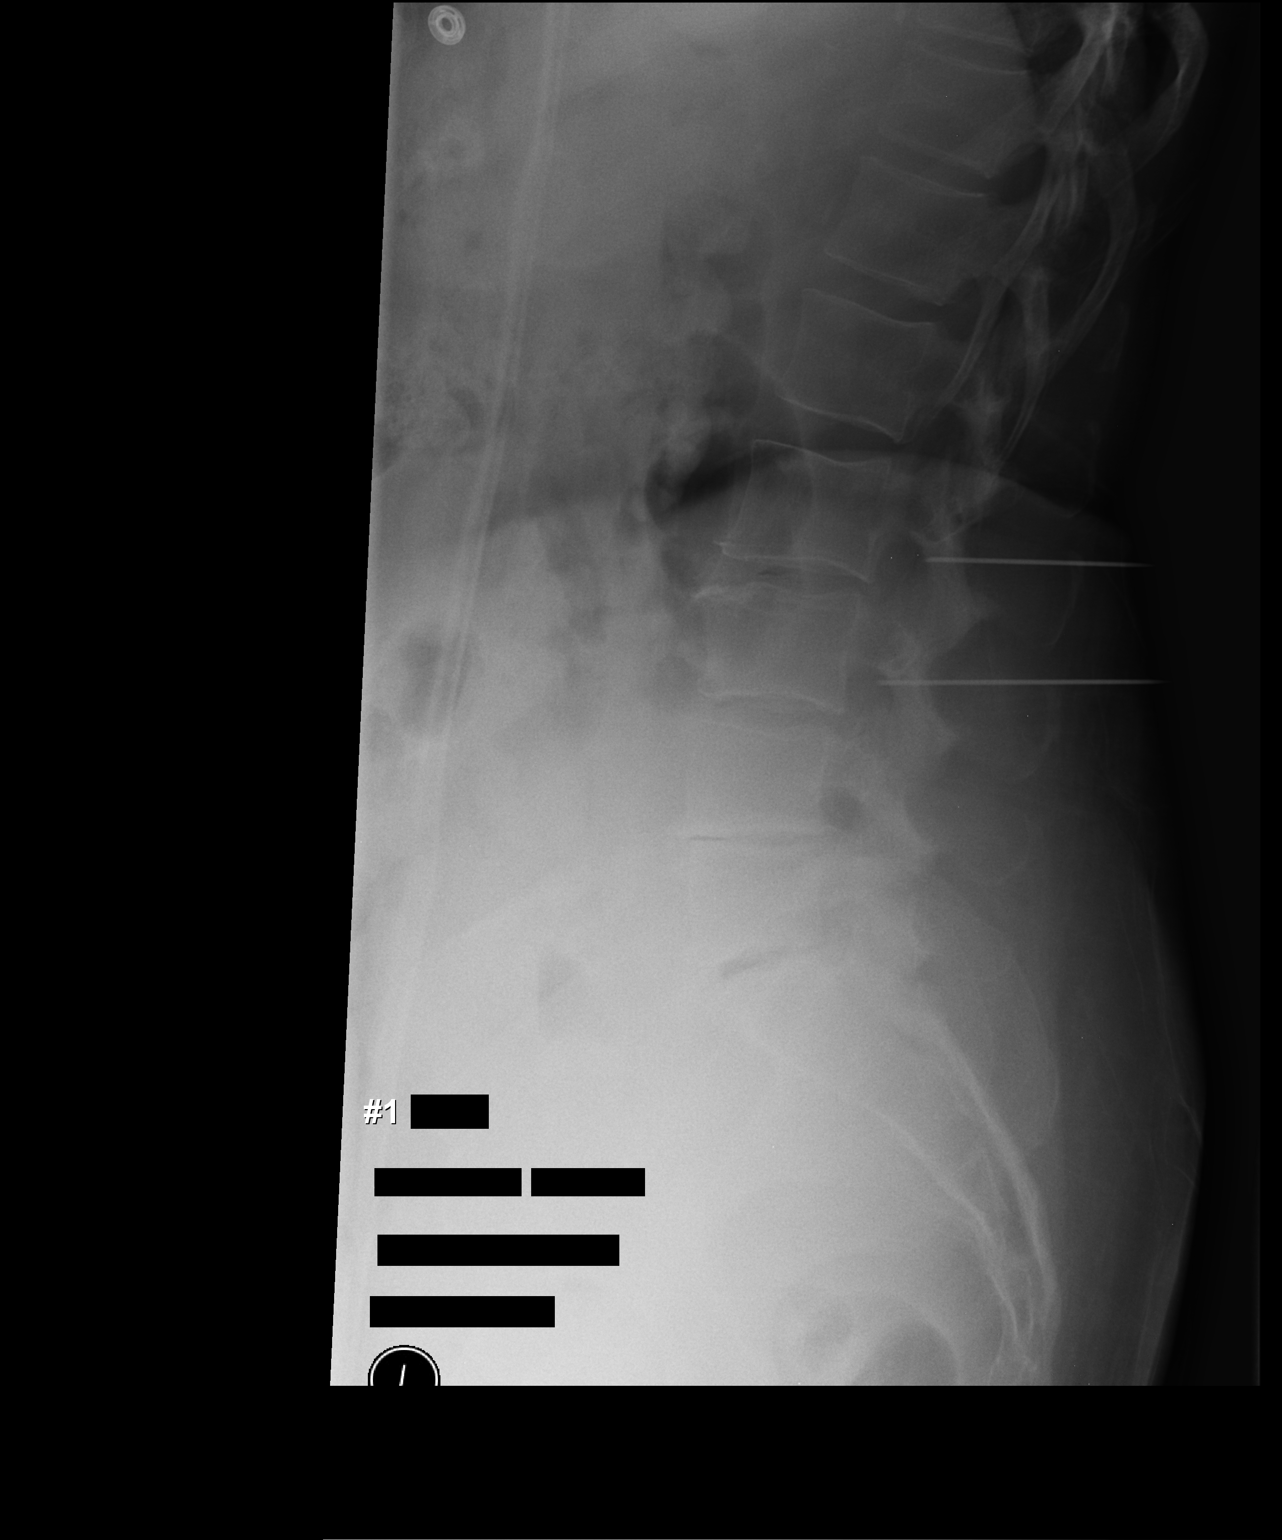

[1 of 1 positions shown; findings below may reference images not displayed]

FINDINGS: The most inferior disc is labeled L5-S1.

On the initial image, needles project over the L2-3 and L3-4
foramina.

On the second image, surgical instruments project over the posterior
elements at the L4 and L5 pedicle levels.
IMPRESSION: Intraoperative localization at L3 through L5 as described.

## 2018-03-15 DIAGNOSIS — N390 Urinary tract infection, site not specified: Secondary | ICD-10-CM | POA: Diagnosis not present

## 2018-07-04 DIAGNOSIS — Z1231 Encounter for screening mammogram for malignant neoplasm of breast: Secondary | ICD-10-CM | POA: Diagnosis not present

## 2018-07-04 DIAGNOSIS — Z803 Family history of malignant neoplasm of breast: Secondary | ICD-10-CM | POA: Diagnosis not present

## 2018-11-08 DIAGNOSIS — R3129 Other microscopic hematuria: Secondary | ICD-10-CM | POA: Diagnosis not present

## 2018-11-08 DIAGNOSIS — N390 Urinary tract infection, site not specified: Secondary | ICD-10-CM | POA: Diagnosis not present

## 2018-11-17 DIAGNOSIS — R399 Unspecified symptoms and signs involving the genitourinary system: Secondary | ICD-10-CM | POA: Diagnosis not present

## 2018-11-17 DIAGNOSIS — E119 Type 2 diabetes mellitus without complications: Secondary | ICD-10-CM | POA: Diagnosis not present

## 2019-02-14 DIAGNOSIS — E785 Hyperlipidemia, unspecified: Secondary | ICD-10-CM | POA: Diagnosis not present

## 2019-02-14 DIAGNOSIS — Z96651 Presence of right artificial knee joint: Secondary | ICD-10-CM | POA: Diagnosis not present

## 2019-02-14 DIAGNOSIS — R011 Cardiac murmur, unspecified: Secondary | ICD-10-CM | POA: Diagnosis not present

## 2019-02-14 DIAGNOSIS — Z79899 Other long term (current) drug therapy: Secondary | ICD-10-CM | POA: Diagnosis not present

## 2019-02-14 DIAGNOSIS — Z0000 Encounter for general adult medical examination without abnormal findings: Secondary | ICD-10-CM | POA: Diagnosis not present

## 2019-02-14 DIAGNOSIS — E559 Vitamin D deficiency, unspecified: Secondary | ICD-10-CM | POA: Diagnosis not present

## 2019-02-14 DIAGNOSIS — M1990 Unspecified osteoarthritis, unspecified site: Secondary | ICD-10-CM | POA: Diagnosis not present

## 2019-02-14 DIAGNOSIS — E1142 Type 2 diabetes mellitus with diabetic polyneuropathy: Secondary | ICD-10-CM | POA: Diagnosis not present

## 2019-02-14 DIAGNOSIS — I10 Essential (primary) hypertension: Secondary | ICD-10-CM | POA: Diagnosis not present

## 2019-02-22 ENCOUNTER — Other Ambulatory Visit (HOSPITAL_COMMUNITY): Payer: Self-pay | Admitting: Family Medicine

## 2019-02-22 DIAGNOSIS — R011 Cardiac murmur, unspecified: Secondary | ICD-10-CM

## 2019-02-23 ENCOUNTER — Ambulatory Visit (HOSPITAL_COMMUNITY): Payer: Medicare HMO | Attending: Cardiology

## 2019-02-23 ENCOUNTER — Other Ambulatory Visit: Payer: Self-pay

## 2019-02-23 DIAGNOSIS — R011 Cardiac murmur, unspecified: Secondary | ICD-10-CM | POA: Insufficient documentation

## 2019-02-28 DIAGNOSIS — E119 Type 2 diabetes mellitus without complications: Secondary | ICD-10-CM | POA: Diagnosis not present

## 2019-05-22 DIAGNOSIS — E119 Type 2 diabetes mellitus without complications: Secondary | ICD-10-CM | POA: Diagnosis not present

## 2019-05-22 DIAGNOSIS — R300 Dysuria: Secondary | ICD-10-CM | POA: Diagnosis not present

## 2019-05-22 DIAGNOSIS — N390 Urinary tract infection, site not specified: Secondary | ICD-10-CM | POA: Diagnosis not present

## 2019-07-11 DIAGNOSIS — Z1231 Encounter for screening mammogram for malignant neoplasm of breast: Secondary | ICD-10-CM | POA: Diagnosis not present

## 2019-08-15 DIAGNOSIS — S8001XA Contusion of right knee, initial encounter: Secondary | ICD-10-CM | POA: Diagnosis not present

## 2019-08-15 DIAGNOSIS — E1142 Type 2 diabetes mellitus with diabetic polyneuropathy: Secondary | ICD-10-CM | POA: Diagnosis not present

## 2019-08-15 DIAGNOSIS — E785 Hyperlipidemia, unspecified: Secondary | ICD-10-CM | POA: Diagnosis not present

## 2019-08-15 DIAGNOSIS — I10 Essential (primary) hypertension: Secondary | ICD-10-CM | POA: Diagnosis not present

## 2019-08-22 ENCOUNTER — Encounter: Payer: Self-pay | Admitting: Orthopaedic Surgery

## 2019-08-22 ENCOUNTER — Ambulatory Visit (INDEPENDENT_AMBULATORY_CARE_PROVIDER_SITE_OTHER): Payer: Medicare HMO

## 2019-08-22 ENCOUNTER — Other Ambulatory Visit: Payer: Self-pay

## 2019-08-22 ENCOUNTER — Ambulatory Visit: Payer: Medicare HMO | Admitting: Orthopaedic Surgery

## 2019-08-22 VITALS — BP 163/73 | HR 74 | Ht 62.5 in | Wt 183.0 lb

## 2019-08-22 DIAGNOSIS — M7051 Other bursitis of knee, right knee: Secondary | ICD-10-CM | POA: Diagnosis not present

## 2019-08-22 DIAGNOSIS — M25561 Pain in right knee: Secondary | ICD-10-CM

## 2019-08-22 DIAGNOSIS — S8001XA Contusion of right knee, initial encounter: Secondary | ICD-10-CM

## 2019-08-22 MED ORDER — LIDOCAINE HCL 1 % IJ SOLN
1.0000 mL | INTRAMUSCULAR | Status: AC | PRN
  Administered 2019-08-22: 1 mL

## 2019-08-22 MED ORDER — BUPIVACAINE HCL 0.5 % IJ SOLN
2.0000 mL | INTRAMUSCULAR | Status: AC | PRN
  Administered 2019-08-22: 2 mL via INTRA_ARTICULAR

## 2019-08-22 MED ORDER — METHYLPREDNISOLONE ACETATE 40 MG/ML IJ SUSP
40.0000 mg | INTRAMUSCULAR | Status: AC | PRN
  Administered 2019-08-22: 40 mg via INTRA_ARTICULAR

## 2019-08-22 NOTE — Progress Notes (Signed)
Office Visit Note   Patient: [name]           Date of Birth: Sep 10, 1940           MRN: [id number] Visit Date: 08/22/2019              Requested by: Mila Palmer, MD [address] [place],  [place] [address] PCP: Mila Palmer, MD   Assessment & Plan: Visit Diagnoses:  1. Acute pain of right knee   2. Contusion of right knee, initial encounter   3. Pes anserinus bursitis of right knee     Plan: Today elected to try conservative treatment with right pes bursa injection.  At the patient and sent right proximal medial knee was prepped with Betadine and Marcaine/Depo-Medrol pes bursa injection was performed.  Tolerated procedure well without complication.  After sitting for a couple of minutes patient reported excellent relief of her knee pain with Marcaine in place.  Advised patient to continue using ice off and on as needed.  Follow-up with Dr. Ophelia Charter in 3 weeks for recheck.  Return sooner if needed.  Follow-Up Instructions: Return in about 3 weeks (around 09/12/2019) for Dr. Ophelia Charter recheck right knee.   Orders:  Orders Placed This Encounter  Procedures  . XR KNEE 3 VIEW RIGHT   No orders of the defined types were placed in this encounter.     Procedures: Large Joint Inj: R knee on 08/22/2019 3:35 PM Indications: pain Details: 25 G needle, anterior approach Medications: 1 mL lidocaine 1 %; 2 mL bupivacaine 0.5 %; 40 mg methylPREDNISolone acetate 40 MG/ML Outcome: tolerated well, no immediate complications  Injection was performed into the right pes bursa Consent was given by the patient. Patient was prepped and draped in the usual sterile fashion.       Clinical Data: No additional findings.   Subjective: Chief Complaint  Patient presents with  . Right Knee - Pain    Fall 07/13/2019    HPI 79 year old white female comes in today with complaints of right knee pain.  Patient is status post right total knee replacement by Dr. Ophelia Charter  June 09, 2010.  States that her knee was doing extremely well up until July 13, 2019 when she was walking in a parking lot of a store when she bent over to pick up something and fell forward with a direct impact on the anterior knee on the pavement. Review of Systems No current cardiac pulmonary GI GU issues  Objective: Vital Signs: BP (!) 163/73   Pulse 74   Ht 5' 2.5" (1.588 m)   Wt 183 lb (83 kg)   BMI 32.94 kg/m   Physical Exam HENT:     Head: Normocephalic.  Pulmonary:     Effort: No respiratory distress.  Musculoskeletal:     Comments: Gait is antalgic.  Right knee range of motion about 0 to 95 degrees with discomfort.  Marked tenderness over the pes bursa and there is some swelling there.  Nontender over the patella.  Joint line nontender.  No signs of infection.  Calf nontender.  Neurological:     Mental Status: She is alert and oriented to person, place, and time.     Ortho Exam  Specialty Comments:  No specialty comments available.  Imaging: XR KNEE 3 VIEW RIGHT  Result Date: 08/22/2019 X-ray right knee shows good alignment and seating of her prosthesis.  No acute finding.  No signs of fracture.    PMFS  History: Patient Active Problem List   Diagnosis Date Noted  . S/P lumbar discectomy 07/26/2015   Past Medical History:  Diagnosis Date  . Arthritis   . High cholesterol   . Hypertension   . Pancreatitis   . PONV (postoperative nausea and vomiting)    after knee surgery    Family History  Problem Relation Age of Onset  . Pancreatic cancer Mother   . Heart disease Father   . Diabetes Father   . Hypertension Father     Past Surgical History:  Procedure Laterality Date  . BACK SURGERY     "knotty" cyst removed from back  . BREAST SURGERY     cyst removed from right breast  . COLONOSCOPY    . JOINT REPLACEMENT Right    knee  . LUMBAR LAMINECTOMY/DECOMPRESSION MICRODISCECTOMY N/A 07/26/2015   Procedure: Left L3-4, L4-5 Microdiscectomy;   Surgeon: Marybelle Killings, MD;  Location: [place];  Service: Orthopedics;  Laterality: N/A;  . TUBAL LIGATION     Social History   Occupational History  . Not on file  Tobacco Use  . Smoking status: Never Smoker  . Smokeless tobacco: Never Used  Substance and Sexual Activity  . Alcohol use: No  . Drug use: No  . Sexual activity: Not on file

## 2019-09-12 ENCOUNTER — Encounter: Payer: Self-pay | Admitting: Orthopaedic Surgery

## 2019-09-12 ENCOUNTER — Other Ambulatory Visit: Payer: Self-pay

## 2019-09-12 ENCOUNTER — Ambulatory Visit: Payer: Medicare HMO | Admitting: Orthopaedic Surgery

## 2019-09-12 DIAGNOSIS — S8001XD Contusion of right knee, subsequent encounter: Secondary | ICD-10-CM

## 2019-09-12 DIAGNOSIS — S8000XA Contusion of unspecified knee, initial encounter: Secondary | ICD-10-CM | POA: Insufficient documentation

## 2019-09-12 NOTE — Progress Notes (Signed)
Office Visit Note   Patient: [name]           Date of Birth: [birth year]           MRN: [id number] Visit Date: 09/12/2019              Requested by: Jonathon Jordan, MD [address] [place],  [place] [address] PCP: Jonathon Jordan, MD   Assessment & Plan: Visit Diagnoses:  1. Contusion of right knee, subsequent encounter     Plan: Good relief from pes bursa injection she is back to normal activity is happy with the results of treatment and can follow-up on an as-needed basis. Follow-Up Instructions: Return if symptoms worsen or fail to improve.   Orders:  No orders of the defined types were placed in this encounter.  No orders of the defined types were placed in this encounter.     Procedures: No procedures performed   Clinical Data: No additional findings.   Subjective: Chief Complaint  Patient presents with  . Right Knee - Follow-up    HPI 79 year old female returns post fall on 07/13/2019 with past bursal injection 08/22/2019.  Patient is walking much better only has trace limp.  She states the knee is bending and straightening she only has occasional slight discomfort but has not stopped her from doing any activities of daily living.  Patient had previous lumbar microdiscectomy and states her back is doing well.  Review of Systems 14 point systems update unchanged from 08/22/2019 office visit other than as mentioned HPI.   Objective: Vital Signs: Ht 5' 2.5" (1.588 m)   Wt 183 lb (83 kg)   BMI 32.94 kg/m   Physical Exam Constitutional:      Appearance: She is well-developed.  HENT:     Head: Normocephalic.     Right Ear: External ear normal.     Left Ear: External ear normal.  Eyes:     Pupils: Pupils are equal, round, and reactive to light.  Neck:     Thyroid: No thyromegaly.     Trachea: No tracheal deviation.  Cardiovascular:     Rate and Rhythm: Normal rate.  Pulmonary:     Effort: Pulmonary effort is normal.    Abdominal:     Palpations: Abdomen is soft.  Skin:    General: Skin is warm and dry.  Neurological:     Mental Status: She is alert and oriented to person, place, and time.  Psychiatric:        Behavior: Behavior normal.     Ortho Exam patient has some slight ecchymosis just distal to the needle site insertion for pes bursa injection.  Full extension full flexion collateral ligaments are stable no joint effusion no crepitus.  Ankle range of motion is full negative popliteal compression test negative logroll the hips.  Specialty Comments:  No specialty comments available.  Imaging: No results found.   PMFS History: Patient Active Problem List   Diagnosis Date Noted  . Knee contusion 09/12/2019  . S/P lumbar discectomy 07/26/2015   Past Medical History:  Diagnosis Date  . Arthritis   . High cholesterol   . Hypertension   . Pancreatitis   . PONV (postoperative nausea and vomiting)    after knee surgery    Family History  Problem Relation Age of Onset  . Pancreatic cancer Mother   . Heart disease Father   . Diabetes Father   . Hypertension Father     Past  Surgical History:  Procedure Laterality Date  . BACK SURGERY     "knotty" cyst removed from back  . BREAST SURGERY     cyst removed from right breast  . COLONOSCOPY    . JOINT REPLACEMENT Right    knee  . LUMBAR LAMINECTOMY/DECOMPRESSION MICRODISCECTOMY N/A 07/26/2015   Procedure: Left L3-4, L4-5 Microdiscectomy;  Surgeon: Eldred Manges, MD;  Location: [facility] OR;  Service: Orthopedics;  Laterality: N/A;  . TUBAL LIGATION     Social History   Occupational History  . Not on file  Tobacco Use  . Smoking status: Never Smoker  . Smokeless tobacco: Never Used  Substance and Sexual Activity  . Alcohol use: No  . Drug use: No  . Sexual activity: Not on file

## 2020-03-05 DIAGNOSIS — M1990 Unspecified osteoarthritis, unspecified site: Secondary | ICD-10-CM | POA: Diagnosis not present

## 2020-03-05 DIAGNOSIS — Z0000 Encounter for general adult medical examination without abnormal findings: Secondary | ICD-10-CM | POA: Diagnosis not present

## 2020-03-05 DIAGNOSIS — Z79899 Other long term (current) drug therapy: Secondary | ICD-10-CM | POA: Diagnosis not present

## 2020-03-05 DIAGNOSIS — E559 Vitamin D deficiency, unspecified: Secondary | ICD-10-CM | POA: Diagnosis not present

## 2020-03-05 DIAGNOSIS — M899 Disorder of bone, unspecified: Secondary | ICD-10-CM | POA: Diagnosis not present

## 2020-03-05 DIAGNOSIS — E785 Hyperlipidemia, unspecified: Secondary | ICD-10-CM | POA: Diagnosis not present

## 2020-03-05 DIAGNOSIS — E1142 Type 2 diabetes mellitus with diabetic polyneuropathy: Secondary | ICD-10-CM | POA: Diagnosis not present

## 2020-03-05 DIAGNOSIS — Z23 Encounter for immunization: Secondary | ICD-10-CM | POA: Diagnosis not present

## 2020-03-05 DIAGNOSIS — D692 Other nonthrombocytopenic purpura: Secondary | ICD-10-CM | POA: Diagnosis not present

## 2020-07-25 DIAGNOSIS — M8580 Other specified disorders of bone density and structure, unspecified site: Secondary | ICD-10-CM | POA: Diagnosis not present

## 2020-07-25 DIAGNOSIS — Z1231 Encounter for screening mammogram for malignant neoplasm of breast: Secondary | ICD-10-CM | POA: Diagnosis not present

## 2021-03-25 DIAGNOSIS — E559 Vitamin D deficiency, unspecified: Secondary | ICD-10-CM | POA: Diagnosis not present

## 2021-03-25 DIAGNOSIS — M1990 Unspecified osteoarthritis, unspecified site: Secondary | ICD-10-CM | POA: Diagnosis not present

## 2021-03-25 DIAGNOSIS — Z0000 Encounter for general adult medical examination without abnormal findings: Secondary | ICD-10-CM | POA: Diagnosis not present

## 2021-03-25 DIAGNOSIS — E785 Hyperlipidemia, unspecified: Secondary | ICD-10-CM | POA: Diagnosis not present

## 2021-03-25 DIAGNOSIS — E1142 Type 2 diabetes mellitus with diabetic polyneuropathy: Secondary | ICD-10-CM | POA: Diagnosis not present

## 2021-03-25 DIAGNOSIS — N3281 Overactive bladder: Secondary | ICD-10-CM | POA: Diagnosis not present

## 2021-03-25 DIAGNOSIS — Z7984 Long term (current) use of oral hypoglycemic drugs: Secondary | ICD-10-CM | POA: Diagnosis not present

## 2021-03-25 DIAGNOSIS — Z96651 Presence of right artificial knee joint: Secondary | ICD-10-CM | POA: Diagnosis not present

## 2021-03-25 DIAGNOSIS — Z79899 Other long term (current) drug therapy: Secondary | ICD-10-CM | POA: Diagnosis not present

## 2021-03-25 DIAGNOSIS — D692 Other nonthrombocytopenic purpura: Secondary | ICD-10-CM | POA: Diagnosis not present

## 2021-03-25 DIAGNOSIS — N390 Urinary tract infection, site not specified: Secondary | ICD-10-CM | POA: Diagnosis not present

## 2021-04-01 ENCOUNTER — Other Ambulatory Visit (HOSPITAL_COMMUNITY): Payer: Self-pay | Admitting: Family Medicine

## 2021-04-01 DIAGNOSIS — I350 Nonrheumatic aortic (valve) stenosis: Secondary | ICD-10-CM

## 2021-04-14 ENCOUNTER — Ambulatory Visit (HOSPITAL_COMMUNITY)
Admission: RE | Admit: 2021-04-14 | Discharge: 2021-04-14 | Disposition: A | Discharge status: Home / Self Care | Payer: Medicare HMO | Source: Ambulatory Visit | Attending: Family Medicine | Admitting: Family Medicine

## 2021-04-14 ENCOUNTER — Other Ambulatory Visit: Payer: Self-pay

## 2021-04-14 DIAGNOSIS — I071 Rheumatic tricuspid insufficiency: Secondary | ICD-10-CM | POA: Diagnosis not present

## 2021-04-14 DIAGNOSIS — I350 Nonrheumatic aortic (valve) stenosis: Secondary | ICD-10-CM | POA: Insufficient documentation

## 2021-04-14 LAB — ECHOCARDIOGRAM COMPLETE
AR max vel: 1.11 cm2
AV Area VTI: 1.12 cm2
AV Area mean vel: 1.13 cm2
AV Mean grad: 11 mmHg
AV Peak grad: 20.6 mmHg
Ao pk vel: 2.27 m/s
Area-P 1/2: 3.48 cm2
S' Lateral: 2.7 cm

## 2021-08-26 DIAGNOSIS — R197 Diarrhea, unspecified: Secondary | ICD-10-CM | POA: Diagnosis not present

## 2021-09-01 DIAGNOSIS — Z1231 Encounter for screening mammogram for malignant neoplasm of breast: Secondary | ICD-10-CM | POA: Diagnosis not present

## 2021-11-06 DIAGNOSIS — M1990 Unspecified osteoarthritis, unspecified site: Secondary | ICD-10-CM | POA: Diagnosis not present

## 2021-11-06 DIAGNOSIS — Z23 Encounter for immunization: Secondary | ICD-10-CM | POA: Diagnosis not present

## 2021-11-06 DIAGNOSIS — G629 Polyneuropathy, unspecified: Secondary | ICD-10-CM | POA: Diagnosis not present

## 2021-11-06 DIAGNOSIS — E1142 Type 2 diabetes mellitus with diabetic polyneuropathy: Secondary | ICD-10-CM | POA: Diagnosis not present

## 2021-11-06 DIAGNOSIS — E039 Hypothyroidism, unspecified: Secondary | ICD-10-CM | POA: Diagnosis not present

## 2022-01-22 DIAGNOSIS — Z23 Encounter for immunization: Secondary | ICD-10-CM | POA: Diagnosis not present

## 2022-01-22 DIAGNOSIS — E1142 Type 2 diabetes mellitus with diabetic polyneuropathy: Secondary | ICD-10-CM | POA: Diagnosis not present

## 2022-04-10 DIAGNOSIS — Z96651 Presence of right artificial knee joint: Secondary | ICD-10-CM | POA: Diagnosis not present

## 2022-04-10 DIAGNOSIS — I350 Nonrheumatic aortic (valve) stenosis: Secondary | ICD-10-CM | POA: Diagnosis not present

## 2022-04-10 DIAGNOSIS — E559 Vitamin D deficiency, unspecified: Secondary | ICD-10-CM | POA: Diagnosis not present

## 2022-04-10 DIAGNOSIS — E785 Hyperlipidemia, unspecified: Secondary | ICD-10-CM | POA: Diagnosis not present

## 2022-04-10 DIAGNOSIS — M1990 Unspecified osteoarthritis, unspecified site: Secondary | ICD-10-CM | POA: Diagnosis not present

## 2022-04-10 DIAGNOSIS — Z79899 Other long term (current) drug therapy: Secondary | ICD-10-CM | POA: Diagnosis not present

## 2022-04-10 DIAGNOSIS — Z0000 Encounter for general adult medical examination without abnormal findings: Secondary | ICD-10-CM | POA: Diagnosis not present

## 2022-04-10 DIAGNOSIS — E1142 Type 2 diabetes mellitus with diabetic polyneuropathy: Secondary | ICD-10-CM | POA: Diagnosis not present

## 2022-04-10 DIAGNOSIS — N3281 Overactive bladder: Secondary | ICD-10-CM | POA: Diagnosis not present

## 2022-04-15 ENCOUNTER — Other Ambulatory Visit (HOSPITAL_COMMUNITY): Payer: Self-pay | Admitting: Family Medicine

## 2022-04-15 DIAGNOSIS — I350 Nonrheumatic aortic (valve) stenosis: Secondary | ICD-10-CM

## 2022-05-07 ENCOUNTER — Ambulatory Visit (HOSPITAL_COMMUNITY): Payer: Medicare HMO | Attending: Internal Medicine

## 2022-05-07 DIAGNOSIS — I350 Nonrheumatic aortic (valve) stenosis: Secondary | ICD-10-CM | POA: Diagnosis not present

## 2022-05-07 LAB — ECHOCARDIOGRAM COMPLETE
AR max vel: 1.19 cm2
AV Area VTI: 1.23 cm2
AV Area mean vel: 1.19 cm2
AV Mean grad: 16 mmHg
AV Peak grad: 28.5 mmHg
Ao pk vel: 2.67 m/s
Area-P 1/2: 2.99 cm2
S' Lateral: 1.6 cm

## 2022-05-12 DIAGNOSIS — H903 Sensorineural hearing loss, bilateral: Secondary | ICD-10-CM | POA: Diagnosis not present

## 2022-06-02 DIAGNOSIS — H903 Sensorineural hearing loss, bilateral: Secondary | ICD-10-CM | POA: Diagnosis not present

## 2022-06-22 DIAGNOSIS — E119 Type 2 diabetes mellitus without complications: Secondary | ICD-10-CM | POA: Diagnosis not present

## 2022-09-07 DIAGNOSIS — Z1231 Encounter for screening mammogram for malignant neoplasm of breast: Secondary | ICD-10-CM | POA: Diagnosis not present

## 2022-10-14 DIAGNOSIS — E46 Unspecified protein-calorie malnutrition: Secondary | ICD-10-CM | POA: Diagnosis not present

## 2022-10-14 DIAGNOSIS — E1165 Type 2 diabetes mellitus with hyperglycemia: Secondary | ICD-10-CM | POA: Diagnosis not present

## 2022-10-14 DIAGNOSIS — E1142 Type 2 diabetes mellitus with diabetic polyneuropathy: Secondary | ICD-10-CM | POA: Diagnosis not present

## 2022-11-04 DIAGNOSIS — H00014 Hordeolum externum left upper eyelid: Secondary | ICD-10-CM | POA: Diagnosis not present

## 2023-04-21 DIAGNOSIS — E559 Vitamin D deficiency, unspecified: Secondary | ICD-10-CM | POA: Diagnosis not present

## 2023-04-21 DIAGNOSIS — E1165 Type 2 diabetes mellitus with hyperglycemia: Secondary | ICD-10-CM | POA: Diagnosis not present

## 2023-04-21 DIAGNOSIS — Z1331 Encounter for screening for depression: Secondary | ICD-10-CM | POA: Diagnosis not present

## 2023-04-21 DIAGNOSIS — Z79899 Other long term (current) drug therapy: Secondary | ICD-10-CM | POA: Diagnosis not present

## 2023-04-21 DIAGNOSIS — E1142 Type 2 diabetes mellitus with diabetic polyneuropathy: Secondary | ICD-10-CM | POA: Diagnosis not present

## 2023-04-21 DIAGNOSIS — Z0000 Encounter for general adult medical examination without abnormal findings: Secondary | ICD-10-CM | POA: Diagnosis not present

## 2023-04-21 DIAGNOSIS — E785 Hyperlipidemia, unspecified: Secondary | ICD-10-CM | POA: Diagnosis not present

## 2023-09-13 DIAGNOSIS — Z1231 Encounter for screening mammogram for malignant neoplasm of breast: Secondary | ICD-10-CM | POA: Diagnosis not present

## 2023-09-22 DIAGNOSIS — M722 Plantar fascial fibromatosis: Secondary | ICD-10-CM | POA: Diagnosis not present

## 2023-10-05 DIAGNOSIS — E119 Type 2 diabetes mellitus without complications: Secondary | ICD-10-CM | POA: Diagnosis not present

## 2023-10-05 DIAGNOSIS — E1142 Type 2 diabetes mellitus with diabetic polyneuropathy: Secondary | ICD-10-CM | POA: Diagnosis not present

## 2023-10-05 DIAGNOSIS — E1165 Type 2 diabetes mellitus with hyperglycemia: Secondary | ICD-10-CM | POA: Diagnosis not present

## 2023-11-01 DIAGNOSIS — E119 Type 2 diabetes mellitus without complications: Secondary | ICD-10-CM | POA: Diagnosis not present

## 2023-11-01 DIAGNOSIS — E785 Hyperlipidemia, unspecified: Secondary | ICD-10-CM | POA: Diagnosis not present

## 2023-11-01 DIAGNOSIS — E1142 Type 2 diabetes mellitus with diabetic polyneuropathy: Secondary | ICD-10-CM | POA: Diagnosis not present

## 2023-11-01 DIAGNOSIS — M1990 Unspecified osteoarthritis, unspecified site: Secondary | ICD-10-CM | POA: Diagnosis not present

## 2023-11-01 DIAGNOSIS — E1165 Type 2 diabetes mellitus with hyperglycemia: Secondary | ICD-10-CM | POA: Diagnosis not present

## 2023-11-03 DIAGNOSIS — E119 Type 2 diabetes mellitus without complications: Secondary | ICD-10-CM | POA: Diagnosis not present

## 2023-11-03 DIAGNOSIS — E1165 Type 2 diabetes mellitus with hyperglycemia: Secondary | ICD-10-CM | POA: Diagnosis not present

## 2023-11-03 DIAGNOSIS — E1142 Type 2 diabetes mellitus with diabetic polyneuropathy: Secondary | ICD-10-CM | POA: Diagnosis not present

## 2023-11-09 DIAGNOSIS — R5382 Chronic fatigue, unspecified: Secondary | ICD-10-CM | POA: Diagnosis not present

## 2023-11-09 DIAGNOSIS — E1142 Type 2 diabetes mellitus with diabetic polyneuropathy: Secondary | ICD-10-CM | POA: Diagnosis not present

## 2023-11-09 DIAGNOSIS — E1165 Type 2 diabetes mellitus with hyperglycemia: Secondary | ICD-10-CM | POA: Diagnosis not present

## 2023-12-02 DIAGNOSIS — E1165 Type 2 diabetes mellitus with hyperglycemia: Secondary | ICD-10-CM | POA: Diagnosis not present

## 2023-12-02 DIAGNOSIS — E785 Hyperlipidemia, unspecified: Secondary | ICD-10-CM | POA: Diagnosis not present

## 2023-12-02 DIAGNOSIS — M1990 Unspecified osteoarthritis, unspecified site: Secondary | ICD-10-CM | POA: Diagnosis not present

## 2023-12-02 DIAGNOSIS — E119 Type 2 diabetes mellitus without complications: Secondary | ICD-10-CM | POA: Diagnosis not present

## 2024-01-02 DIAGNOSIS — E785 Hyperlipidemia, unspecified: Secondary | ICD-10-CM | POA: Diagnosis not present

## 2024-01-02 DIAGNOSIS — M1990 Unspecified osteoarthritis, unspecified site: Secondary | ICD-10-CM | POA: Diagnosis not present

## 2024-01-02 DIAGNOSIS — E1142 Type 2 diabetes mellitus with diabetic polyneuropathy: Secondary | ICD-10-CM | POA: Diagnosis not present

## 2024-01-02 DIAGNOSIS — E1165 Type 2 diabetes mellitus with hyperglycemia: Secondary | ICD-10-CM | POA: Diagnosis not present

## 2024-01-02 DIAGNOSIS — E119 Type 2 diabetes mellitus without complications: Secondary | ICD-10-CM | POA: Diagnosis not present

## 2024-03-24 DIAGNOSIS — I10 Essential (primary) hypertension: Secondary | ICD-10-CM | POA: Diagnosis not present

## 2024-03-24 DIAGNOSIS — E119 Type 2 diabetes mellitus without complications: Secondary | ICD-10-CM | POA: Diagnosis not present

## 2024-03-24 DIAGNOSIS — I359 Nonrheumatic aortic valve disorder, unspecified: Secondary | ICD-10-CM | POA: Diagnosis not present

## 2024-03-24 DIAGNOSIS — Z833 Family history of diabetes mellitus: Secondary | ICD-10-CM | POA: Diagnosis not present

## 2024-03-24 DIAGNOSIS — Z7984 Long term (current) use of oral hypoglycemic drugs: Secondary | ICD-10-CM | POA: Diagnosis not present

## 2024-03-24 DIAGNOSIS — Z809 Family history of malignant neoplasm, unspecified: Secondary | ICD-10-CM | POA: Diagnosis not present

## 2024-03-24 DIAGNOSIS — E785 Hyperlipidemia, unspecified: Secondary | ICD-10-CM | POA: Diagnosis not present

## 2024-04-01 DIAGNOSIS — E1165 Type 2 diabetes mellitus with hyperglycemia: Secondary | ICD-10-CM | POA: Diagnosis not present

## 2024-04-01 DIAGNOSIS — E119 Type 2 diabetes mellitus without complications: Secondary | ICD-10-CM | POA: Diagnosis not present

## 2024-04-01 DIAGNOSIS — E1142 Type 2 diabetes mellitus with diabetic polyneuropathy: Secondary | ICD-10-CM | POA: Diagnosis not present

## 2024-04-02 DIAGNOSIS — E1142 Type 2 diabetes mellitus with diabetic polyneuropathy: Secondary | ICD-10-CM | POA: Diagnosis not present

## 2024-04-02 DIAGNOSIS — E1165 Type 2 diabetes mellitus with hyperglycemia: Secondary | ICD-10-CM | POA: Diagnosis not present

## 2024-04-02 DIAGNOSIS — E119 Type 2 diabetes mellitus without complications: Secondary | ICD-10-CM | POA: Diagnosis not present

## 2024-04-02 DIAGNOSIS — E785 Hyperlipidemia, unspecified: Secondary | ICD-10-CM | POA: Diagnosis not present

## 2024-04-02 DIAGNOSIS — M1990 Unspecified osteoarthritis, unspecified site: Secondary | ICD-10-CM | POA: Diagnosis not present
